# Patient Record
Sex: Female | Born: 1950
Health system: Southern US, Community
[De-identification: ages and names within clinical notes are randomized; demographics above are authoritative.]

## PROBLEM LIST (undated history)

## (undated) DIAGNOSIS — J45909 Unspecified asthma, uncomplicated: Secondary | ICD-10-CM

## (undated) DIAGNOSIS — T7840XA Allergy, unspecified, initial encounter: Secondary | ICD-10-CM

## (undated) DIAGNOSIS — M1990 Unspecified osteoarthritis, unspecified site: Secondary | ICD-10-CM

## (undated) DIAGNOSIS — K219 Gastro-esophageal reflux disease without esophagitis: Secondary | ICD-10-CM

## (undated) DIAGNOSIS — G43909 Migraine, unspecified, not intractable, without status migrainosus: Secondary | ICD-10-CM

## (undated) DIAGNOSIS — I10 Essential (primary) hypertension: Secondary | ICD-10-CM

## (undated) DIAGNOSIS — J302 Other seasonal allergic rhinitis: Secondary | ICD-10-CM

## (undated) HISTORY — PX: TOOTH EXTRACTION: SUR596

## (undated) HISTORY — DX: Allergy, unspecified, initial encounter: T78.40XA

## (undated) HISTORY — DX: Gastro-esophageal reflux disease without esophagitis: K21.9

## (undated) HISTORY — PX: TUBAL LIGATION: SHX77

## (undated) HISTORY — DX: Unspecified osteoarthritis, unspecified site: M19.90

## (undated) HISTORY — DX: Migraine, unspecified, not intractable, without status migrainosus: G43.909

## (undated) HISTORY — DX: Unspecified asthma, uncomplicated: J45.909

## (undated) HISTORY — DX: Other seasonal allergic rhinitis: J30.2

---

## 2012-05-31 ENCOUNTER — Emergency Department (HOSPITAL_COMMUNITY)
Admission: EM | Admit: 2012-05-31 | Discharge: 2012-05-31 | Disposition: A | Discharge status: Home / Self Care | Payer: No Typology Code available for payment source | Attending: Emergency Medicine | Admitting: Emergency Medicine

## 2012-05-31 ENCOUNTER — Emergency Department (HOSPITAL_COMMUNITY): Payer: No Typology Code available for payment source

## 2012-05-31 ENCOUNTER — Encounter (HOSPITAL_COMMUNITY): Payer: Self-pay

## 2012-05-31 DIAGNOSIS — IMO0002 Reserved for concepts with insufficient information to code with codable children: Secondary | ICD-10-CM | POA: Insufficient documentation

## 2012-05-31 DIAGNOSIS — Y92410 Unspecified street and highway as the place of occurrence of the external cause: Secondary | ICD-10-CM | POA: Insufficient documentation

## 2012-05-31 DIAGNOSIS — IMO0001 Reserved for inherently not codable concepts without codable children: Secondary | ICD-10-CM | POA: Insufficient documentation

## 2012-05-31 DIAGNOSIS — Y939 Activity, unspecified: Secondary | ICD-10-CM | POA: Insufficient documentation

## 2012-05-31 DIAGNOSIS — I10 Essential (primary) hypertension: Secondary | ICD-10-CM | POA: Insufficient documentation

## 2012-05-31 DIAGNOSIS — T148XXA Other injury of unspecified body region, initial encounter: Secondary | ICD-10-CM

## 2012-05-31 HISTORY — DX: Essential (primary) hypertension: I10

## 2012-05-31 MED ORDER — TRAMADOL HCL 50 MG PO TABS: 50.0000 mg | ORAL_TABLET | Freq: Four times a day (QID) | ORAL | Status: DC | PRN
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
## 2012-06-01 NOTE — ED Provider Notes (Signed)
Medical screening examination/treatment/procedure(s) were performed by non-physician practitioner and as supervising physician I was immediately available for consultation/collaboration.  Cherylyn Sundby M Jaryd Drew, MD 06/01/12 0123 

## 2017-03-15 DIAGNOSIS — Z23 Encounter for immunization: Secondary | ICD-10-CM | POA: Diagnosis not present

## 2017-08-13 ENCOUNTER — Encounter (HOSPITAL_BASED_OUTPATIENT_CLINIC_OR_DEPARTMENT_OTHER): Payer: Self-pay

## 2017-08-13 ENCOUNTER — Emergency Department (HOSPITAL_BASED_OUTPATIENT_CLINIC_OR_DEPARTMENT_OTHER)
Admission: EM | Admit: 2017-08-13 | Discharge: 2017-08-13 | Disposition: A | Discharge status: Home / Self Care | Payer: Medicare Other | Attending: Emergency Medicine | Admitting: Emergency Medicine

## 2017-08-13 ENCOUNTER — Emergency Department (HOSPITAL_BASED_OUTPATIENT_CLINIC_OR_DEPARTMENT_OTHER): Payer: Medicare Other

## 2017-08-13 ENCOUNTER — Other Ambulatory Visit: Payer: Self-pay

## 2017-08-13 DIAGNOSIS — R519 Headache, unspecified: Secondary | ICD-10-CM

## 2017-08-13 DIAGNOSIS — R51 Headache: Secondary | ICD-10-CM | POA: Diagnosis not present

## 2017-08-13 DIAGNOSIS — Z87891 Personal history of nicotine dependence: Secondary | ICD-10-CM | POA: Insufficient documentation

## 2017-08-13 DIAGNOSIS — R42 Dizziness and giddiness: Secondary | ICD-10-CM | POA: Insufficient documentation

## 2017-08-13 DIAGNOSIS — I10 Essential (primary) hypertension: Secondary | ICD-10-CM | POA: Insufficient documentation

## 2017-08-13 DIAGNOSIS — M791 Myalgia, unspecified site: Secondary | ICD-10-CM | POA: Diagnosis not present

## 2017-08-13 LAB — BASIC METABOLIC PANEL
Anion gap: 8 (ref 5–15)
BUN: 9 mg/dL (ref 6–20)
CHLORIDE: 102 mmol/L (ref 101–111)
CO2: 26 mmol/L (ref 22–32)
CREATININE: 0.93 mg/dL (ref 0.44–1.00)
Calcium: 9.4 mg/dL (ref 8.9–10.3)
GFR calc Af Amer: >60 mL/min (ref >60)
Glucose, Bld: 116 mg/dL — ABNORMAL HIGH (ref 65–99)
POTASSIUM: 3.6 mmol/L (ref 3.5–5.1)
Sodium: 136 mmol/L (ref 135–145)

## 2017-08-13 LAB — CBC
HCT: 37.2 % (ref 36.0–46.0)
Hemoglobin: 12.6 g/dL (ref 12.0–15.0)
MCH: 30.6 pg (ref 26.0–34.0)
MCHC: 33.9 g/dL (ref 30.0–36.0)
MCV: 90.3 fL (ref 78.0–100.0)
PLATELETS: 226 10*3/uL (ref 150–400)
RBC: 4.12 MIL/uL (ref 3.87–5.11)
RDW: 12.2 % (ref 11.5–15.5)
WBC: 4.7 10*3/uL (ref 4.0–10.5)

## 2017-08-13 MED ORDER — DIPHENHYDRAMINE HCL 50 MG/ML IJ SOLN
25.0000 mg | Freq: Once | INTRAMUSCULAR | Status: AC
  Administered 2017-08-13: 25 mg via INTRAVENOUS
  Filled 2017-08-13: qty 1

## 2017-08-13 MED ORDER — PROCHLORPERAZINE EDISYLATE 5 MG/ML IJ SOLN
10.0000 mg | Freq: Once | INTRAMUSCULAR | Status: AC
  Administered 2017-08-13: 10 mg via INTRAVENOUS
  Filled 2017-08-13: qty 2

## 2017-08-13 MED ORDER — KETOROLAC TROMETHAMINE 15 MG/ML IJ SOLN
15.0000 mg | Freq: Once | INTRAMUSCULAR | Status: AC
  Administered 2017-08-13: 15 mg via INTRAVENOUS
  Filled 2017-08-13: qty 1

## 2017-08-13 NOTE — ED Provider Notes (Signed)
Complains of frontal throbbing headache gradual onset 2 days ago was left arm and left leg pain.  She has not had similar headaches before.  She does get migraine headaches approximately once per month.  She is presently asymptomatic since treatment here.  On exam alert Glasgow Coma Score 15 appears in no distress neurologic exam gait normal Romberg normal pronator drift normal cranial nerves II through XII grossly intact.  All 4 extremities neurovascular intact.   Orlie Dakin, MD 08/13/17 [phone]

## 2017-08-13 NOTE — ED Notes (Signed)
NAD at this time. Pt is stable and going home.  

## 2017-08-13 NOTE — ED Triage Notes (Addendum)
Pt c/o pain "to my left side"-pain to left side of head, neck, arm and leg x 2 days-feeling unbalanced x today-denies injury-NAD-steady gait-pt later added left ear feels "stopped up"

## 2017-08-13 NOTE — ED Provider Notes (Signed)
[facility] EMERGENCY DEPARTMENT Provider Note   CSN: [id number] Arrival date & time: 08/13/17  1403     History   Chief Complaint Chief Complaint  Patient presents with  . Headache    HPI Joann Drake is a 67 y.o. female.  HPI   Joann Drake is a 67 year old female with a history of hypertension and migraines who presents to the emergency department for evaluation of headache.  Patient reports that her headache began 2 days ago.  It has been located in the bitemporal area, but now seems to be localizing on the left side of her head.  She states that her pain is 8/10 in severity at this time, feels "burning" in nature.  She reports that the pain spreads down her neck and into her left arm and posterior left leg.  She states that this is atypical of her normal migraine because the pain spreads to the left side of her body.  She tried taking Aleve and Tylenol with some improvement, but did not completely resolve her symptoms.  She states that she has photophobia at times.  She denies associated nausea/vomiting.  States that this morning around 1 AM she felt somewhat off balance, as if she had to lean towards the side to walk.  She denies visual disturbance, numbness, weakness, dysarthria, dysphagia, neck stiffness, fever, chest pain, shortness of breath, abdominal pain.  Denies sudden onset worst headache of her life. Denies history of cancer. She is able to ambulate independently without difficulty.   Past Medical History:  Diagnosis Date  . Hypertension     There are no active problems to display for this patient.   Past Surgical History:  Procedure Laterality Date  . TUBAL LIGATION      OB History    No data available       Home Medications    Prior to Admission medications   Medication Sig Start Date End Date Taking? Authorizing Provider  AMLODIPINE BESYLATE PO Take by mouth.   Yes [provider]    Family History No family history on  file.  Social History Social History   Tobacco Use  . Smoking status: Former Research scientist (life sciences)  . Smokeless tobacco: Never Used  Substance Use Topics  . Alcohol use: No  . Drug use: No     Allergies   Penicillins   Review of Systems Review of Systems  Constitutional: Negative for chills and fever.  HENT: Negative for facial swelling, sinus pressure and sinus pain.   Eyes: Positive for photophobia. Negative for visual disturbance.  Respiratory: Negative for shortness of breath.   Cardiovascular: Negative for chest pain.  Gastrointestinal: Negative for abdominal pain, nausea and vomiting.  Genitourinary: Negative for difficulty urinating.  Musculoskeletal: Positive for myalgias (left arm and leg pain). Negative for back pain, gait problem and neck stiffness.  Skin: Negative for rash.  Neurological: Positive for dizziness (feels "off balance") and headaches. Negative for speech difficulty, weakness and numbness.  Psychiatric/Behavioral: Negative for agitation.     Physical Exam Updated Vital Signs BP (!) 145/99 (BP Location: Right Arm)   Pulse 74   Temp 98.3 F (36.8 C) (Oral)   Resp 18   Ht 5\' 2"  (1.575 m)   Wt 84.9 kg (187 lb 2.7 oz)   SpO2 100%   BMI 34.23 kg/m   Physical Exam  Constitutional: She is oriented to person, place, and time. She appears well-developed and well-nourished. No distress.  HENT:  Head: Normocephalic and atraumatic.  Mouth/Throat: Oropharynx is clear and moist. No oropharyngeal exudate.  No tenderness or erythema over temporal arteries.  Eyes: Conjunctivae are normal. Pupils are equal, round, and reactive to light. Right eye exhibits no discharge. Left eye exhibits no discharge.  Neck: Normal range of motion. Neck supple.  Cardiovascular: Normal rate, regular rhythm and intact distal pulses.  Pulmonary/Chest: Effort normal and breath sounds normal. No stridor. No respiratory distress. She has no wheezes. She has no rales.  Abdominal: Soft. Bowel  sounds are normal. There is no tenderness.  Musculoskeletal: Normal range of motion.  No tenderness to palpation over the arms or legs. No rash, ecchymosis or erythema over the legs or arms. Intact distal pulses in all extremities.   Lymphadenopathy:    She has no cervical adenopathy.  Neurological: She is alert and oriented to person, place, and time. Coordination normal.  Mental Status:  Alert, oriented, thought content appropriate, able to give a coherent history. Speech fluent without evidence of aphasia. Able to follow 2 step commands without difficulty.  Cranial Nerves:  II:  Peripheral visual fields grossly normal, pupils equal, round, reactive to light III,IV, VI: ptosis not present, extra-ocular motions intact bilaterally  V,VII: smile symmetric, facial light touch sensation equal VIII: hearing grossly normal to voice  X: uvula elevates symmetrically  XI: bilateral shoulder shrug symmetric and strong XII: midline tongue extension without fassiculations Motor:  Normal tone. 5/5 in upper and lower extremities bilaterally including strong and equal grip strength and dorsiflexion/plantar flexion Sensory: Pinprick and light touch normal in all extremities.  Deep Tendon Reflexes: 1+ and symmetric in the patella Cerebellar: normal finger-to-nose with bilateral upper extremities Gait: normal gait and balance CV: distal pulses palpable throughout   Skin: Skin is warm and dry. Capillary refill takes less than 2 seconds. She is not diaphoretic.  Psychiatric: She has a normal mood and affect. Her behavior is normal.  Nursing note and vitals reviewed.    ED Treatments / Results  Labs (all labs ordered are listed, but only abnormal results are displayed) Labs Reviewed - No data to display  EKG  EKG Interpretation None       Radiology No results found.  Procedures Procedures (including critical care time)  Medications Ordered in ED Medications - No data to  display   Initial Impression / Assessment and Plan / ED Course  I have reviewed the triage vital signs and the nursing notes.  Pertinent labs & imaging results that were available during my care of the patient were reviewed by me and considered in my medical decision making (see chart for details).     Patient a history of migraine headaches presents to the emergency department for evaluation of left-sided headache which is been ongoing for the past 2 days.  She also reports pain in her left arm and leg as well which is different from her typical headache.  CT head without acute abnormality. Patient HA treated and improved while in ED, with subsequent improvement in pain of her arm and leg. No fever or nuchal rigidity to suggest meningitis. Presentation non-concerning for temporal arteritis. No sudden onset worst headache of my life, no concern for [facility]. Pt is afebrile with no focal neuro deficits or change in vision. Able to walk independently in the ER without difficulty. Pt is to follow up with PCP to discuss prophylactic medication. Her blood pressure was elevated in the ER today, counseled her to have this rechecked and managed by her PCP. Her blood sugar was  also mildly elevated 116, discussed having this rechecked by her PCP as well. This was a shared visit with Dr. Winfred Leeds who also saw the patient and agrees with plan and discharge. Pt verbalizes understanding and is agreeable with plan.    Final Clinical Impressions(s) / ED Diagnoses   Final diagnoses:  Acute nonintractable headache, unspecified headache type    ED Discharge Orders    None       Glyn Ade, PA-C 08/13/17 1607    Orlie Dakin, MD 08/13/17 [phone]

## 2017-08-13 NOTE — Discharge Instructions (Signed)
The CT scan of your head and blood work was reassuring.  Your blood pressure was mildly elevated in the ER today, please have this rechecked and monitored by your regular doctor in the next 3 weeks. You blood sugar was also somewhat elevated (bg 116), please have this checked as well by your regular doctor.   Return to the ER if you have headache with trouble seeing, numbness or weakness. Please also return if your have any new or concerning symptoms.

## 2017-09-03 ENCOUNTER — Encounter: Payer: Self-pay | Admitting: Family Medicine

## 2017-09-03 ENCOUNTER — Ambulatory Visit (INDEPENDENT_AMBULATORY_CARE_PROVIDER_SITE_OTHER): Payer: Medicare Other | Admitting: Family Medicine

## 2017-09-03 VITALS — BP 132/78 | HR 83 | Temp 98.1°F | Ht 60.25 in | Wt 184.0 lb

## 2017-09-03 DIAGNOSIS — Z7689 Persons encountering health services in other specified circumstances: Secondary | ICD-10-CM | POA: Diagnosis not present

## 2017-09-03 DIAGNOSIS — G43109 Migraine with aura, not intractable, without status migrainosus: Secondary | ICD-10-CM

## 2017-09-03 DIAGNOSIS — H6122 Impacted cerumen, left ear: Secondary | ICD-10-CM

## 2017-09-03 DIAGNOSIS — R7309 Other abnormal glucose: Secondary | ICD-10-CM

## 2017-09-03 DIAGNOSIS — Z1211 Encounter for screening for malignant neoplasm of colon: Secondary | ICD-10-CM

## 2017-09-03 DIAGNOSIS — I10 Essential (primary) hypertension: Secondary | ICD-10-CM | POA: Insufficient documentation

## 2017-09-03 DIAGNOSIS — R7303 Prediabetes: Secondary | ICD-10-CM | POA: Insufficient documentation

## 2017-09-03 DIAGNOSIS — R42 Dizziness and giddiness: Secondary | ICD-10-CM | POA: Diagnosis not present

## 2017-09-03 MED ORDER — HYDROCHLOROTHIAZIDE 25 MG PO TABS: 25.0000 mg | ORAL_TABLET | Freq: Every day | ORAL | 3 refills | Status: DC

## 2017-09-03 MED ORDER — AMLODIPINE BESYLATE 5 MG PO TABS: 5.0000 mg | ORAL_TABLET | Freq: Every day | ORAL | 3 refills | Status: DC

## 2017-09-03 NOTE — Progress Notes (Signed)
Subjective:    Patient ID: Joann Drake, female    DOB: [birth year], 67 y.o.   MRN: [id number]  HPI This is a 67 yo female who presents today to establish care. She is married. She has 4 children (one son passed away 2 years ago). Has 7 grandchildren, 7 great grandchildren. She is a retired Theme park manager. Enjoys gardening, takes care of 2 of her grandchildren.   Has been getting care from Josue Hector, NP.   Headache- was seen 08/13/17 in ER with headache, negative head CT, has been diagnosed with migraine in past. Pattern varies, sometimes several times a month, sometimes more often. Has not been able to identify triggers. Takes Excedrin Migraine, Alleve with little relief. +photophobia, +phonophobia.   Has been having intermittent vertigo, sometimes with position change, but not always. Left ear feels clogged up. She instilled some OTC wax removal which made her ear feel more stopped up. No pain. Uses cotton swabs daily, does not get much out of her ear.   HTN- controlled on amlodipine 5 mg and HCTZ 25 mg. Labs in ER 08/13/17 showed normal BUN/Creatinine, elevated blood sugar, normal electrolytes.   Last CPE- a couple of years ago [facility], 1-2 years, does SBE Pap- a couple of years Colonoscopy- never Tdap- unsure Flu- annual and has had a pneumonia shot Eye- 3 years Dental- dentures Exercise- not as regularly since she started watching her grandchildren, previously walked a lot.  Sleep- 6-8 hours a night   Past Medical History:  Diagnosis Date  . Allergy   . Arthritis   . Hypertension   . Migraine    Past Surgical History:  Procedure Laterality Date  . TOOTH EXTRACTION    . TUBAL LIGATION     Family History  Problem Relation Age of Onset  . Asthma Mother   . Hypertension Mother   . Miscarriages / [place] Mother   . Stroke Mother   . Pneumonia Father   . Heart disease Sister   . Hypertension Sister   . Stroke Sister   . Kidney disease  Daughter   . Early death Son   . Kidney disease Son    Social History   Tobacco Use  . Smoking status: Former Research scientist (life sciences)  . Smokeless tobacco: Never Used  Substance Use Topics  . Alcohol use: No  . Drug use: No      Review of Systems Per HPI    Objective:   Physical Exam  Constitutional: She is oriented to person, place, and time. She appears well-developed and well-nourished. No distress.  HENT:  Head: Normocephalic and atraumatic.  Right Ear: Tympanic membrane, external ear and ear canal normal.  Left Ear: External ear normal.  Mouth/Throat: Oropharynx is clear and moist.  Left TM occluded by cerumen. Irrigated with warm tap water without relief.   Eyes: Conjunctivae are normal.  Cardiovascular: Normal rate, regular rhythm and normal heart sounds.  Pulmonary/Chest: Effort normal and breath sounds normal.  Musculoskeletal: She exhibits no edema.  Neurological: She is alert and oriented to person, place, and time.  Skin: Skin is warm and dry. She is not diaphoretic.  Psychiatric: She has a normal mood and affect. Her behavior is normal. Judgment and thought content normal.  Vitals reviewed.     BP 132/78   Pulse 83   Temp 98.1 F (36.7 C) (Oral)   Ht 5' 0.25" (1.53 m)   Wt 184 lb (83.5 kg)   SpO2 97%   BMI 35.64  kg/m      Orthostatic VS for the past 24 hrs:  BP- Lying Pulse- Lying BP- Sitting Pulse- Sitting BP- Standing at 0 minutes Pulse- Standing at 0 minutes  09/03/17 1650 136/88 76 136/84 77 (!) 134/92 90     Assessment & Plan:  1. Encounter to establish care - will request records from prior PCP - follow up in 3 months for CPE  2. Essential hypertension - well controlled today - amLODipine (NORVASC) 5 MG tablet; Take 1 tablet (5 mg total) by mouth daily.  Dispense: 90 tablet; Refill: 3 - hydrochlorothiazide (HYDRODIURIL) 25 MG tablet; Take 1 tablet (25 mg total) by mouth daily.  Dispense: 90 tablet; Refill: 3 - TSH - Lipid Panel  3. Elevated  glucose - Hemoglobin A1c  4. Migraine with aura and without status migrainosus, not intractable - Ambulatory referral to Neurology  5. Vertigo - She is not orthostatic, will treat cerumen impaction - RTC precautions reviewed  6. Impacted cerumen of left ear - she was given instructions for mineral oil on cotton ball x 5 minutes in ear every other day for 7 days  7. Colon cancer screening - discussed options for screening including Colo guard and colonoscopy, will discuss further at upcoming visit.   Clarene Reamer, FNP-BC  [facility]  09/03/2017 5:22 PM

## 2017-09-03 NOTE — Patient Instructions (Signed)
Good to see you today  Please schedule your complete physical for 3 months   Migraine Headache A migraine headache is a very strong throbbing pain on one side or both sides of your head. Migraines can also cause other symptoms. Talk with your doctor about what things may bring on (trigger) your migraine headaches. Follow these instructions at home: Medicines  Take over-the-counter and prescription medicines only as told by your doctor.  Do not drive or use heavy machinery while taking prescription pain medicine.  To prevent or treat constipation while you are taking prescription pain medicine, your doctor may recommend that you: ? Drink enough fluid to keep your pee (urine) clear or pale yellow. ? Take over-the-counter or prescription medicines. ? Eat foods that are high in fiber. These include fresh fruits and vegetables, whole grains, and beans. ? Limit foods that are high in fat and processed sugars. These include fried and sweet foods. Lifestyle  Avoid alcohol.  Do not use any products that contain nicotine or tobacco, such as cigarettes and e-cigarettes. If you need help quitting, ask your doctor.  Get at least 8 hours of sleep every night.  Limit your stress. General instructions   Keep a journal to find out what may bring on your migraines. For example, write down: ? What you eat and drink. ? How much sleep you get. ? Any change in what you eat or drink. ? Any change in your medicines.  If you have a migraine: ? Avoid things that make your symptoms worse, such as bright lights. ? It may help to lie down in a dark, quiet room. ? Do not drive or use heavy machinery. ? Ask your doctor what activities are safe for you.  Keep all follow-up visits as told by your doctor. This is important. Contact a doctor if:  You get a migraine that is different or worse than your usual migraines. Get help right away if:  Your migraine gets very bad.  You have a fever.  You  have a stiff neck.  You have trouble seeing.  Your muscles feel weak or like you cannot control them.  You start to lose your balance a lot.  You start to have trouble walking.  You pass out (faint). This information is not intended to replace advice given to you by your health care provider. Make sure you discuss any questions you have with your health care provider. Document Released: 02/22/2008 Document Revised: 12/03/2015 Document Reviewed: 11/01/2015 Elsevier Interactive Patient Education  2018 [employer].

## 2017-09-04 LAB — LIPID PANEL
CHOL/HDL RATIO: 3
Cholesterol: 210 mg/dL — ABNORMAL HIGH (ref 0–200)
HDL: 64.2 mg/dL (ref >39.00)
LDL Cholesterol: 107 mg/dL — ABNORMAL HIGH (ref 0–99)
NonHDL: 146.15
TRIGLYCERIDES: 198 mg/dL — AB (ref 0.0–149.0)
VLDL: 39.6 mg/dL (ref 0.0–40.0)

## 2017-09-04 LAB — HEMOGLOBIN A1C: Hgb A1c MFr Bld: 5.9 % (ref 4.6–6.5)

## 2017-09-04 LAB — TSH: TSH: 1.32 u[IU]/mL (ref 0.35–4.50)

## 2017-09-28 ENCOUNTER — Telehealth: Payer: Self-pay

## 2017-09-28 NOTE — Telephone Encounter (Signed)
Called and left voicemail explaining recommendations. (Verified on DPR)

## 2017-09-28 NOTE — Telephone Encounter (Signed)
Pt walked in and told carrie since blood sugar was elevated wants glucose meter to [facility]. 09/03/17 A1C was 5.9.Not on any meds for diabetes.Please advise. Pt last seen 09/03/17.

## 2017-09-28 NOTE — Telephone Encounter (Signed)
Please call patient and tell her that we can talk about testing her blood sugars at home next month at her visit. Remind her that she is in the prediabetic range and we usually don't have people do home testing.

## 2017-11-07 ENCOUNTER — Ambulatory Visit (INDEPENDENT_AMBULATORY_CARE_PROVIDER_SITE_OTHER): Payer: Medicare Other | Admitting: Diagnostic Neuroimaging

## 2017-11-07 ENCOUNTER — Encounter: Payer: Self-pay | Admitting: Diagnostic Neuroimaging

## 2017-11-07 ENCOUNTER — Encounter (INDEPENDENT_AMBULATORY_CARE_PROVIDER_SITE_OTHER): Payer: Self-pay

## 2017-11-07 VITALS — BP 120/75 | HR 66 | Ht 60.25 in | Wt 173.0 lb

## 2017-11-07 DIAGNOSIS — G43101 Migraine with aura, not intractable, with status migrainosus: Secondary | ICD-10-CM | POA: Diagnosis not present

## 2017-11-07 MED ORDER — TOPIRAMATE 50 MG PO TABS: 50.0000 mg | ORAL_TABLET | Freq: Two times a day (BID) | ORAL | 12 refills | Status: DC

## 2017-11-07 MED ORDER — RIZATRIPTAN BENZOATE 10 MG PO TBDP: 10.0000 mg | ORAL_TABLET | ORAL | 11 refills | Status: DC | PRN

## 2017-11-07 NOTE — Patient Instructions (Signed)
  MIGRAINE PREVENTION - start topiramate 50mg  at bedtime; after 1 week increase to twice a day; drink plenty of water  MIGRAINE RESCUE - rizatriptan 10mg  as needed for breakthrough headache; may repeat x 1 after 2 hours; max 2 tabs per day or 8 per month

## 2017-11-07 NOTE — Progress Notes (Signed)
[facility]  PATIENT: Joann Drake DOB: 1950/11/23  REFERRING CLINICIAN: Glenda Chroman, NP HISTORY FROM: patient  REASON FOR VISIT: new consult    HISTORICAL  CHIEF COMPLAINT:  Chief Complaint  Patient presents with  . Migraine    rm 7, New Pt, "chronic migraine headaches that wake me during sleeping, tense all down my neck; have had migraiens for years, was on med that gave me hallucinations and memory loss; no meds in a long time""    HISTORY OF PRESENT ILLNESS:   67 year old female here for evaluation of headaches.  Patient has had headaches since teenage years.  She describes global, unilateral, migratory severe throbbing headaches with nausea, photophobia and phonophobia.  Headaches would last hours, days or even weeks at a time.  She tried over-the-counter medications without relief.  She has tried tramadol and another prescription medication without relief.  Sometimes she feels visual aura symptoms before onset of headache.  Triggering factors could include caffeine and certain types of food such as raisins or other sweets.  Patient is averaging at least 20 days of headache per month.  Headaches have not significantly changed in characteristics over time.  She is having more frequent headaches, but gradually worsening over many years.  No other associated neurologic symptoms such as numbness, weakness, slurred speech.  REVIEW OF SYSTEMS: Full 14 system review of systems performed and negative with exception of: Headache dizziness joint pain aching muscles murmur.   ALLERGIES: Allergies  Allergen Reactions  . Penicillins Hives and Rash    HOME MEDICATIONS: Outpatient Medications Prior to Visit  Medication Sig Dispense Refill  . amLODipine (NORVASC) 5 MG tablet Take 1 tablet (5 mg total) by mouth daily. 90 tablet 3  . hydrochlorothiazide (HYDRODIURIL) 25 MG tablet Take 1 tablet (25 mg total) by mouth daily. 90 tablet 3   No  facility-administered medications prior to visit.     PAST MEDICAL HISTORY: Past Medical History:  Diagnosis Date  . Allergy   . Arthritis   . Hypertension   . Migraine     PAST SURGICAL HISTORY: Past Surgical History:  Procedure Laterality Date  . TOOTH EXTRACTION    . TUBAL LIGATION      FAMILY HISTORY: Family History  Problem Relation Age of Onset  . Asthma Mother   . Hypertension Mother   . Miscarriages / [place] Mother   . Stroke Mother   . Pneumonia Father   . Heart disease Sister   . Hypertension Sister   . Stroke Sister   . Kidney disease Daughter   . Early death Son   . Kidney disease Son     SOCIAL HISTORY:  Social History   Socioeconomic History  . Marital status: Married    Spouse name: Not on file  . Number of children: Not on file  . Years of education: Not on file  . Highest education level: Not on file  Occupational History  . Not on file  Social Needs  . Financial resource strain: Not on file  . Food insecurity:    Worry: Not on file    Inability: Not on file  . Transportation needs:    Medical: Not on file    Non-medical: Not on file  Tobacco Use  . Smoking status: Former Research scientist (life sciences)  . Smokeless tobacco: Never Used  Substance and Sexual Activity  . Alcohol use: No  . Drug use: No  . Sexual activity: Not Currently  Lifestyle  . Physical activity:  Days per week: Not on file    Minutes per session: Not on file  . Stress: Not on file  Relationships  . Social connections:    Talks on phone: Not on file    Gets together: Not on file    Attends religious service: Not on file    Active member of club or organization: Not on file    Attends meetings of clubs or organizations: Not on file    Relationship status: Not on file  . Intimate partner violence:    Fear of current or ex partner: Not on file    Emotionally abused: Not on file    Physically abused: Not on file    Forced sexual activity: Not on file  Other Topics Concern    . Not on file  Social History Narrative   Married   Education- college   Children - 3 living     PHYSICAL EXAM  GENERAL EXAM/CONSTITUTIONAL: Vitals:  Vitals:   11/07/17 0947  BP: 120/75  Pulse: 66  Weight: 173 lb (78.5 kg)  Height: 5' 0.25" (1.53 m)     Body mass index is 33.51 kg/m.  Visual Acuity Screening   Right eye Left eye Both eyes  Without correction:     With correction: 20/70 20/100   Comments: Wears bifocals, unable to see well    Patient is in no distress; well developed, nourished and groomed; neck is supple  CARDIOVASCULAR:  Examination of carotid arteries is normal; no carotid bruits  Regular rate and rhythm, no murmurs  Examination of peripheral vascular system by observation and palpation is normal  EYES:  Ophthalmoscopic exam of optic discs and posterior segments is normal; no papilledema or hemorrhages  MUSCULOSKELETAL:  Gait, strength, tone, movements noted in Neurologic exam below  NEUROLOGIC: MENTAL STATUS:  No flowsheet data found.  awake, alert, oriented to person, place and time  recent and remote memory intact  normal attention and concentration  language fluent, comprehension intact, naming intact,   fund of knowledge appropriate  CRANIAL NERVE:   2nd - no papilledema on fundoscopic exam  2nd, 3rd, 4th, 6th - pupils equal and reactive to light, visual fields full to confrontation, extraocular muscles intact, no nystagmus  5th - facial sensation symmetric  7th - facial strength symmetric  8th - hearing intact  9th - palate elevates symmetrically, uvula midline  11th - shoulder shrug symmetric  12th - tongue protrusion midline  MOTOR:   normal bulk and tone, full strength in the BUE, BLE  SENSORY:   normal and symmetric to light touch,  temperature, vibration  COORDINATION:   finger-nose-finger, fine finger movements normal  REFLEXES:   deep tendon reflexes present and symmetric  GAIT/STATION:    narrow based gait; romberg is negative    DIAGNOSTIC DATA (LABS, IMAGING, TESTING) - I reviewed patient records, labs, notes, testing and imaging myself where available.  Lab Results  Component Value Date   WBC 4.7 08/13/2017   HGB 12.6 08/13/2017   HCT 37.2 08/13/2017   MCV 90.3 08/13/2017   PLT 226 08/13/2017      Component Value Date/Time   NA 136 08/13/2017 1458   K 3.6 08/13/2017 1458   CL 102 08/13/2017 1458   CO2 26 08/13/2017 1458   GLUCOSE 116 (H) 08/13/2017 1458   BUN 9 08/13/2017 1458   CREATININE 0.93 08/13/2017 1458   CALCIUM 9.4 08/13/2017 1458   GFRNONAA >60 08/13/2017 1458   GFRAA >60 08/13/2017 1458  Lab Results  Component Value Date   CHOL 210 (H) 09/03/2017   HDL 64.20 09/03/2017   LDLCALC 107 (H) 09/03/2017   TRIG 198.0 (H) 09/03/2017   CHOLHDL 3 09/03/2017   Lab Results  Component Value Date   HGBA1C 5.9 09/03/2017   No results found for: VITAMINB12 Lab Results  Component Value Date   TSH 1.32 09/03/2017    08/13/17 CT head [I reviewed images myself and agree with interpretation. -VRP]  - normal     ASSESSMENT AND PLAN  67 y.o. year old female here with headaches with migraine features since teenage years, with continued headaches currently.  Neurologic exam and CT of the head are unremarkable.  We will proceed with migraine treatment.   Dx:  1. Migraine with aura and with status migrainosus, not intractable      PLAN:  MIGRAINE WITH AURA  - start topiramate 50mg  at bedtime; after 1 week increase to twice a day; drink plenty of water  - rizatriptan 10mg  as needed for breakthrough headache; may repeat x 1 after 2 hours; max 2 tabs per day or 8 per month  Meds ordered this encounter  Medications  . topiramate (TOPAMAX) 50 MG tablet    Sig: Take 1 tablet (50 mg total) by mouth 2 (two) times daily.    Dispense:  60 tablet    Refill:  12  . rizatriptan (MAXALT-MLT) 10 MG disintegrating tablet    Sig: Take 1 tablet (10 mg  total) by mouth as needed for migraine. May repeat in 2 hours if needed    Dispense:  9 tablet    Refill:  11   Return in about 6 months (around 05/09/2018) for with NP.    Penni Bombard, MD 0/81/4481, 85:63 AM Certified in Neurology, Neurophysiology and [facility] [address] [place], [place] [address] [phone]

## 2017-11-21 ENCOUNTER — Encounter: Payer: Medicare Other | Admitting: Family Medicine

## 2017-11-21 DIAGNOSIS — Z0289 Encounter for other administrative examinations: Secondary | ICD-10-CM

## 2017-12-31 ENCOUNTER — Other Ambulatory Visit: Payer: Self-pay

## 2017-12-31 ENCOUNTER — Emergency Department (HOSPITAL_COMMUNITY): Payer: Medicare Other

## 2017-12-31 ENCOUNTER — Ambulatory Visit: Payer: Self-pay | Admitting: *Deleted

## 2017-12-31 ENCOUNTER — Emergency Department (HOSPITAL_COMMUNITY)
Admission: EM | Admit: 2017-12-31 | Discharge: 2017-12-31 | Disposition: A | Discharge status: Home / Self Care | Payer: Medicare Other | Attending: Emergency Medicine | Admitting: Emergency Medicine

## 2017-12-31 ENCOUNTER — Encounter (HOSPITAL_COMMUNITY): Payer: Self-pay | Admitting: *Deleted

## 2017-12-31 DIAGNOSIS — Z87891 Personal history of nicotine dependence: Secondary | ICD-10-CM | POA: Insufficient documentation

## 2017-12-31 DIAGNOSIS — Z7982 Long term (current) use of aspirin: Secondary | ICD-10-CM | POA: Insufficient documentation

## 2017-12-31 DIAGNOSIS — Z79899 Other long term (current) drug therapy: Secondary | ICD-10-CM | POA: Insufficient documentation

## 2017-12-31 DIAGNOSIS — R0789 Other chest pain: Secondary | ICD-10-CM | POA: Diagnosis not present

## 2017-12-31 DIAGNOSIS — I10 Essential (primary) hypertension: Secondary | ICD-10-CM | POA: Insufficient documentation

## 2017-12-31 DIAGNOSIS — R079 Chest pain, unspecified: Secondary | ICD-10-CM | POA: Diagnosis not present

## 2017-12-31 LAB — CBC
HCT: 41.6 % (ref 36.0–46.0)
Hemoglobin: 13.3 g/dL (ref 12.0–15.0)
MCH: 29.6 pg (ref 26.0–34.0)
MCHC: 32 g/dL (ref 30.0–36.0)
MCV: 92.4 fL (ref 78.0–100.0)
PLATELETS: 248 10*3/uL (ref 150–400)
RBC: 4.5 MIL/uL (ref 3.87–5.11)
RDW: 12.6 % (ref 11.5–15.5)
WBC: 4.8 10*3/uL (ref 4.0–10.5)

## 2017-12-31 LAB — BASIC METABOLIC PANEL
Anion gap: 10 (ref 5–15)
BUN: 11 mg/dL (ref 8–23)
CO2: 28 mmol/L (ref 22–32)
CREATININE: 1.06 mg/dL — AB (ref 0.44–1.00)
Calcium: 10.2 mg/dL (ref 8.9–10.3)
Chloride: 98 mmol/L (ref 98–111)
GFR calc non Af Amer: 53 mL/min — ABNORMAL LOW (ref >60)
Glucose, Bld: 134 mg/dL — ABNORMAL HIGH (ref 70–99)
Potassium: 4.1 mmol/L (ref 3.5–5.1)
Sodium: 136 mmol/L (ref 135–145)

## 2017-12-31 LAB — I-STAT TROPONIN, ED
Troponin i, poc: 0 ng/mL (ref 0.00–0.08)
Troponin i, poc: 0 ng/mL (ref 0.00–0.08)

## 2017-12-31 MED ORDER — METHOCARBAMOL 500 MG PO TABS
1000.0000 mg | ORAL_TABLET | Freq: Once | ORAL | Status: AC
  Administered 2017-12-31: 1000 mg via ORAL
  Filled 2017-12-31: qty 2

## 2017-12-31 MED ORDER — METHOCARBAMOL 500 MG PO TABS: 500.0000 mg | ORAL_TABLET | Freq: Two times a day (BID) | ORAL | 0 refills | Status: DC

## 2017-12-31 MED ORDER — ACETAMINOPHEN 500 MG PO TABS
1000.0000 mg | ORAL_TABLET | Freq: Once | ORAL | Status: AC
  Administered 2017-12-31: 1000 mg via ORAL
  Filled 2017-12-31: qty 2

## 2017-12-31 NOTE — Discharge Instructions (Addendum)
Your work-up today is very reassuring, and does not suggest an acute problem with your heart or lungs causing your pain.  Since this pain can be reproduced on exam I think it is more likely musculoskeletal pain.  Please continue taking Tylenol, and you may also use Robaxin at night to help with this discomfort, Robaxin can cause drowsiness do not take before driving.  Please follow-up later this week with your primary care doctor.  Return to the emergency department for worsening chest pain, shortness of breath, lightheadedness or dizziness or any other new or concerning symptoms.

## 2017-12-31 NOTE — ED Provider Notes (Addendum)
Patient placed in Quick Look pathway, seen and evaluated   Chief Complaint: chest pain  HPI:  Joann Drake is a 67 y.o. female who presents to the ED with her family member for chest pain. Pt is in with chest pain under right breast and right shoulder and arm pain since. The pain started last night. Patient reports cough that started this morning that had light red sputum.   ROS: Resp: shortness of breath, productive cough  Cardiac: chest pain    Physical Exam:  BP 138/87 (BP Location: Left Arm)   Pulse 80   Temp 98.1 F (36.7 C) (Oral)   Resp 16   SpO2 100%    Gen: No distress  Neuro: Awake and Alert  Skin: Warm and dry  Chest: tender with palpation of the right chest wall. Increased pain with raising right arm, tender with palpation to the posterior right shoulder.     Initiation of care has begun. The patient has been counseled on the process, plan, and necessity for staying for the completion/evaluation, and the remainder of the medical screening examination    Ashley Murrain, NP 12/31/17 [place], [place], NP 01/03/18 1815    Quintella Reichert, MD 01/04/18 [phone]

## 2017-12-31 NOTE — Telephone Encounter (Signed)
Patient is experiencing right-sided CP under the breast that radiated to her back.  She was experiencing sweating with the pain during the night. Rates it 10 on the pain scale. Describes it as a sharp stabbing pain that has been constant since the onset yesterday evening. Has SOB that worsens with movement and deep breathing. Has a cough that started about the same time. Denies fever.Took one 81 mg asa this morning. Has not taken any thing else for the pain. Stated she had this type of pain several months ago. Referred patient to the ER. Daughter will drive her at this time to [facility] ER. Reason for Disposition . [1] Chest pain lasts > 5 minutes AND [2] age > 55 AND [3] at least one cardiac risk factor (i.e., hypertension, diabetes, obesity, smoker or strong family history of heart disease)  Answer Assessment - Initial Assessment Questions 1. LOCATION: "Where does it hurt?"       Right side under breast  2. RADIATION: "Does the pain go anywhere else?" (e.g., into neck, jaw, arms, back)     Radiates around to the back. 3. ONSET: "When did the chest pain begin?" (Minutes, hours or days)      Started yesterday evening. 4. PATTERN "Does the pain come and go, or has it been constant since it started?"  "Does it get worse with exertion?"      Constant pain and hurts worse with movement.  5. DURATION: "How long does it last" (e.g., seconds, minutes, hours)     Has not stopped being a 10 6. SEVERITY: "How bad is the pain?"  (e.g., Scale 1-10; mild, moderate, or severe)    - MILD (1-3): doesn't interfere with normal activities     - MODERATE (4-7): interferes with normal activities or awakens from sleep    - SEVERE (8-10): excruciating pain, unable to do any normal activities       severe 7. CARDIAC RISK FACTORS: "Do you have any history of heart problems or risk factors for heart disease?" (e.g., prior heart attack, angina; high blood pressure, diabetes, being overweight, high cholesterol, smoking, or  strong family history of heart disease)     Blood pressure,sister has heart disease 8. PULMONARY RISK FACTORS: "Do you have any history of lung disease?"  (e.g., blood clots in lung, asthma, emphysema, birth control pills)     asthma 9. CAUSE: "What do you think is causing the chest pain?"     Does not know. 10. OTHER SYMPTOMS: "Do you have any other symptoms?" (e.g., dizziness, nausea, vomiting, sweating, fever, difficulty breathing, cough)       Sweating during the night. SOB at times. Cough recently started.  11. PREGNANCY: "Is there any chance you are pregnant?" "When was your last menstrual period?"       no  Protocols used: CHEST PAIN-A-AH

## 2017-12-31 NOTE — ED Triage Notes (Signed)
Pt is in with chest pain under right breast and right shoulder and arm pain since.  No other pain.

## 2017-12-31 NOTE — Telephone Encounter (Signed)
Unable to reach pt or pts daughter to verify did go to ED.

## 2017-12-31 NOTE — Telephone Encounter (Signed)
Per EMR, patient presented to ED.

## 2017-12-31 NOTE — ED Provider Notes (Addendum)
[facility] EMERGENCY DEPARTMENT Provider Note   CSN: [id number] Arrival date & time: 12/31/17  1424     History   Chief Complaint Chief Complaint  Patient presents with  . Chest Pain    HPI Landry Costanza is a 67 y.o. female.  Betzaida Cremeens is a 67 y.o. Female with a history of hypertension, migraines and arthritis, who presents to the emergency department for evaluation of chest pain.  Patient reports that at about 3:00 in the morning she noticed some pain under her right breast and on the right side of her chest that seemed to wrap around a bit to her back.  Since then pain has been persistent, is worse with certain movements and when she tries to take a very deep breath.  Patient reports some very mild shortness of breath.  She has had some intermittent cough, occasionally productive sputum, and this seems to make the pain worse.  She denies any fevers or chills.  Chest pain is not worse with exertion, does not radiate to the arm neck or jaw.  No associated diaphoresis, nausea or vomiting.  No syncope or lightheadedness.  No associated abdominal pain.  Patient has not taken anything prior to arrival to treat the symptoms.  She denies any associated lower extremity swelling or pain.  No history of DVT or PE, no recent long distance travel or surgery, no hemoptysis.  Remote smoking history 40 years ago, no personal history of cardiac events, some family history and much older family members.     Past Medical History:  Diagnosis Date  . Allergy   . Arthritis   . Hypertension   . Migraine     Patient Active Problem List   Diagnosis Date Noted  . Essential hypertension 09/03/2017  . Elevated glucose 09/03/2017    Past Surgical History:  Procedure Laterality Date  . TOOTH EXTRACTION    . TUBAL LIGATION       OB History   None      Home Medications    Prior to Admission medications   Medication Sig Start Date End Date Taking? Authorizing  Provider  amLODipine (NORVASC) 5 MG tablet Take 1 tablet (5 mg total) by mouth daily. 09/03/17  Yes Elby Beck, FNP  aspirin EC 81 MG tablet Take 81 mg by mouth daily as needed (pain).   Yes [provider]  aspirin-acetaminophen-caffeine (EXCEDRIN MIGRAINE) [phone] MG tablet Take 2 tablets by mouth every 8 (eight) hours as needed for headache or migraine.   Yes [provider]  Aspirin-Salicylamide-Caffeine (BC HEADACHE POWDER PO) Take 1-2 packets by mouth 2 (two) times daily as needed (migraine).   Yes [provider]  hydrochlorothiazide (HYDRODIURIL) 25 MG tablet Take 1 tablet (25 mg total) by mouth daily. 09/03/17  Yes Elby Beck, FNP  mineral oil liquid See admin instructions. Instill 4-5 drops in to left ear as needed for wax buildup   Yes [provider]  Multiple Vitamin (MULTIVITAMIN WITH MINERALS) TABS tablet Take 1 tablet by mouth daily.   Yes [provider]  peppermint oil liquid Apply topically as needed (headache/migraine).   Yes [provider]  rizatriptan (MAXALT-MLT) 10 MG disintegrating tablet Take 1 tablet (10 mg total) by mouth as needed for migraine. May repeat in 2 hours if needed Patient not taking: Reported on 12/31/2017 11/07/17   Penumalli, Earlean Polka, MD  topiramate (TOPAMAX) 50 MG tablet Take 1 tablet (50 mg total) by mouth 2 (two) times  daily. Patient not taking: Reported on 12/31/2017 11/07/17   Penni Bombard, MD    Family History Family History  Problem Relation Age of Onset  . Asthma Mother   . Hypertension Mother   . Miscarriages / [place] Mother   . Stroke Mother   . Pneumonia Father   . Heart disease Sister   . Hypertension Sister   . Stroke Sister   . Kidney disease Daughter   . Early death Son   . Kidney disease Son     Social History Social History   Tobacco Use  . Smoking status: Former Research scientist (life sciences)  . Smokeless tobacco: Never Used  Substance Use Topics  . Alcohol use: No    . Drug use: No     Allergies   Penicillins   Review of Systems Review of Systems  Constitutional: Negative for chills and fever.  HENT: Negative for congestion, rhinorrhea and sore throat.   Eyes: Negative for visual disturbance.  Respiratory: Positive for cough and shortness of breath. Negative for chest tightness.   Cardiovascular: Positive for chest pain. Negative for palpitations and leg swelling.  Gastrointestinal: Negative for abdominal pain, nausea and vomiting.  Genitourinary: Negative for dysuria and frequency.  Musculoskeletal: Negative for arthralgias and myalgias.  Skin: Negative for color change and rash.  Neurological: Negative for dizziness, syncope, weakness, light-headedness, numbness and headaches.     Physical Exam Updated Vital Signs BP 114/67   Pulse 63   Temp 98.1 F (36.7 C) (Oral)   Resp 15   SpO2 99%   Physical Exam  Constitutional: She appears well-developed and well-nourished.  Non-toxic appearance. She does not appear ill. No distress.  HENT:  Head: Normocephalic and atraumatic.  Eyes: Pupils are equal, round, and reactive to light. EOM are normal. Right eye exhibits no discharge. Left eye exhibits no discharge.  Neck: Normal range of motion. Neck supple. No JVD present. No tracheal deviation present.  Cardiovascular: Normal rate, regular rhythm, normal heart sounds and intact distal pulses. Exam reveals no gallop and no friction rub.  No murmur heard. Pulses:      Radial pulses are 2+ on the right side, and 2+ on the left side.       Dorsalis pedis pulses are 2+ on the right side, and 2+ on the left side.       Posterior tibial pulses are 2+ on the right side, and 2+ on the left side.  Pulmonary/Chest: Effort normal and breath sounds normal. No respiratory distress.  Respirations equal and unlabored, patient able to speak in full sentences, lungs clear to auscultation bilaterally.  Chest pain is reproducible under the right breast and over  the right chest wall, there are no overlying skin changes, no breast tenderness  Abdominal: Soft. Bowel sounds are normal. She exhibits no distension and no mass. There is no tenderness. There is no guarding.  Abdomen soft, nondistended, nontender to palpation in all quadrants without guarding or peritoneal signs  Musculoskeletal:       Right lower leg: She exhibits no tenderness and no edema.       Left lower leg: She exhibits no tenderness and no edema.  Neurological: She is alert. Coordination normal.  Skin: Skin is warm and dry. Capillary refill takes less than 2 seconds. She is not diaphoretic.  Psychiatric: She has a normal mood and affect. Her behavior is normal.  Nursing note and vitals reviewed.    ED Treatments / Results  Labs (all labs ordered are listed,  but only abnormal results are displayed) Labs Reviewed  BASIC METABOLIC PANEL - Abnormal; Notable for the following components:      Result Value   Glucose, Bld 134 (*)    Creatinine, Ser 1.06 (*)    GFR calc non Af Amer 53 (*)    All other components within normal limits  CBC  I-STAT TROPONIN, ED  I-STAT TROPONIN, ED    EKG EKG Interpretation  Date/Time:  Monday December 31 2017 14:46:34 EDT Ventricular Rate:  80 PR Interval:  168 QRS Duration: 84 QT Interval:  366 QTC Calculation: 422 R Axis:   -4 Text Interpretation:  Normal sinus rhythm Low voltage QRS Nonspecific T wave abnormality Abnormal ECG Confirmed by Gerlene Fee [phone]) on 12/31/2017 9:28:23 PM   Radiology Dg Chest 2 View  Result Date: 12/31/2017 CLINICAL DATA:  Chest pain for 2 days EXAM: CHEST - 2 VIEW COMPARISON:  None. FINDINGS: The heart size and mediastinal contours are within normal limits. Both lungs are clear. The visualized skeletal structures are unremarkable. IMPRESSION: No active cardiopulmonary disease. Electronically Signed   By: Inez Catalina M.D.   On: 12/31/2017 16:07    Procedures Procedures (including critical care  time)  Medications Ordered in ED Medications  acetaminophen (TYLENOL) tablet 1,000 mg (1,000 mg Oral Given 12/31/17 1938)  methocarbamol (ROBAXIN) tablet 1,000 mg (1,000 mg Oral Given 12/31/17 1939)     Initial Impression / Assessment and Plan / ED Course  I have reviewed the triage vital signs and the nursing notes.  Pertinent labs & imaging results that were available during my care of the patient were reviewed by me and considered in my medical decision making (see chart for details).  Chest pain is not likely of cardiac or pulmonary etiology d/t presentation, chest pain is reproducible on exam.  PERC negative, VSS, no tracheal deviation, no JVD or new murmur, RRR, breath sounds equal bilaterally, EKG without acute abnormalities, negative troponin, and negative CXR.  Chest pain completely resolved with Tylenol and Robaxin.  Patient is to be discharged with recommendation to follow up with PCP in regards to today's hospital visit. Pt has been advised to return to the ED if CP becomes exertional, associated with diaphoresis or nausea, radiates to left jaw/arm, worsens or becomes concerning in any way. Pt appears reliable for follow up and is agreeable to discharge.   Case has been discussed with and seen by Dr. Sedonia Small who agrees with the above plan to discharge.    Final Clinical Impressions(s) / ED Diagnoses   Final diagnoses:  Atypical chest pain  Chest wall pain    ED Discharge Orders        Ordered    methocarbamol (ROBAXIN) 500 MG tablet  2 times daily     12/31/17 2112       Jacqlyn Larsen, [place] 12/31/17 2113    Jacqlyn Larsen, PA-C 12/31/17 2129    Maudie Flakes, MD 12/31/17 2259

## 2018-02-05 ENCOUNTER — Ambulatory Visit: Payer: Self-pay | Admitting: Family Medicine

## 2018-02-05 NOTE — Telephone Encounter (Signed)
I returned her call.   She is c/o being dizzy and very tired for the past week.   Feeling lightheaded.  See triage notes.  I made her an appt with Dr. Danise Mina for 02/06/18 at 10:15.  Clarene Reamer did not have any availability.  Reason for Disposition . [1] MODERATE dizziness (e.g., interferes with normal activities) AND [2] has NOT been evaluated by physician for this  (Exception: dizziness caused by heat exposure, sudden standing, or poor fluid intake)  Answer Assessment - Initial Assessment Questions 1. DESCRIPTION: "Describe your dizziness."     I've been dizziness for a week and feeling really tired. 2. LIGHTHEADED: "Do you feel lightheaded?" (e.g., somewhat faint, woozy, weak upon standing)     I feel light headed.   I've not fainted. 3. VERTIGO: "Do you feel like either you or the room is spinning or tilting?" (i.e. vertigo)     No 4. SEVERITY: "How bad is it?"  "Do you feel like you are going to faint?" "Can you stand and walk?"   - MILD - walking normally   - MODERATE - interferes with normal activities (e.g., work, school)    - SEVERE - unable to stand, requires support to walk, feels like passing out now.      Yes.  Sometimes I bump into the wall if I'm not straight.    When driving it's like something is going around.   I knew where I was but I felt confused.    I got home ok. 5. ONSET:  "When did the dizziness begin?"     Sometime last week maybe Saturday. 6. AGGRAVATING FACTORS: "Does anything make it worse?" (e.g., standing, change in head position)     When I get up I get more lightheaded and when I get out of bed. 7. HEART RATE: "Can you tell me your heart rate?" "How many beats in 15 seconds?"  (Note: not all patients can do this)       No 8. CAUSE: "What do you think is causing the dizziness?"     I don't know. 9. RECURRENT SYMPTOM: "Have you had dizziness before?" If so, ask: "When was the last time?" "What happened that time?"     I've had dizziness before but  not this tired feeling.   10. OTHER SYMPTOMS: "Do you have any other symptoms?" (e.g., fever, chest pain, vomiting, diarrhea, bleeding)       None of the above. 11. PREGNANCY: "Is there any chance you are pregnant?" "When was your last menstrual period?"       Not asked due to age  Protocols used: DIZZINESS [facility]

## 2018-02-05 NOTE — Telephone Encounter (Signed)
Unable to reach pt for any other symptoms and to advised should not drive if dizzy.

## 2018-02-06 ENCOUNTER — Ambulatory Visit (INDEPENDENT_AMBULATORY_CARE_PROVIDER_SITE_OTHER): Payer: Medicare Other | Admitting: Family Medicine

## 2018-02-06 ENCOUNTER — Encounter: Payer: Self-pay | Admitting: Family Medicine

## 2018-02-06 VITALS — BP 136/84 | HR 76 | Temp 98.7°F | Ht 60.25 in | Wt 180.0 lb

## 2018-02-06 DIAGNOSIS — R42 Dizziness and giddiness: Secondary | ICD-10-CM

## 2018-02-06 DIAGNOSIS — I10 Essential (primary) hypertension: Secondary | ICD-10-CM | POA: Diagnosis not present

## 2018-02-06 DIAGNOSIS — G43909 Migraine, unspecified, not intractable, without status migrainosus: Secondary | ICD-10-CM | POA: Diagnosis not present

## 2018-02-06 DIAGNOSIS — H6122 Impacted cerumen, left ear: Secondary | ICD-10-CM | POA: Insufficient documentation

## 2018-02-06 DIAGNOSIS — R5383 Other fatigue: Secondary | ICD-10-CM | POA: Diagnosis not present

## 2018-02-06 LAB — VITAMIN D 25 HYDROXY (VIT D DEFICIENCY, FRACTURES): VITD: 21.75 ng/mL — AB (ref 30.00–100.00)

## 2018-02-06 LAB — VITAMIN B12: Vitamin B-12: 903 pg/mL (ref 211–911)

## 2018-02-06 NOTE — Progress Notes (Signed)
BP 136/84 (BP Location: Left Arm, Patient Position: Sitting, Cuff Size: Large)   Pulse 76   Temp 98.7 F (37.1 C) (Oral)   Ht 5' 0.25" (1.53 m)   Wt 180 lb (81.6 kg)   SpO2 98%   BMI 34.86 kg/m   Orthostatic VS for the past 24 hrs (Last 3 readings):  BP- Lying BP- Standing at 0 minutes  02/06/18 1016 - (!) 150/100  02/06/18 1014 140/90 -    CC: dizziness, fatigue.  Subjective:    Patient ID: Joann Drake, female    DOB: Apr 28, 1951, 67 y.o.   MRN: [id number]  HPI: Joann Drake is a 67 y.o. female presenting on 02/06/2018 for Dizziness (C/o dizziness for about 1 wk. ) and Fatigue (C/o fatigue about 1 mo.)   Endorses constant fatigue over the past month and then dizziness "woozy feeling" worse over the past week, described as unsteadiness imbalance with walking, no vertigo or syncope/pre-syncope. Some mental fogginess also noted as well as some blurry vision R>L. No new headaches. Some R arm and shoulder pain worse in the mornings. Overall has difficulty describing symptoms today. Chronic L ear fullness - has been using mineral oil, had unsuccessful irrigation last office visit.  Episode a few months ago while driving of trouble differentiating right from left, got turned around - seen at ER with headache and head CT was unrevealing (07/2017) - referred to neurologist [facility]) who started her on topamax/maxalt. She takes excedrin migraine abortively for migraines - states she never tried topamax or maxalt due to cost. Having about 2 migraines/wk, not as severe as previously.   Denies fevers/chills, chest pain or dyspnea, palpitations. No unilateral weakness or numbness, facial weakness, slurred speech. No double vision. No paresthesias.   HTN - on amlodipine 5mg  daily, hctz 25mg  daily. No changes recently.   Relevant past medical, surgical, family and social history reviewed and updated as indicated. Interim medical history since our last visit reviewed. Allergies and  medications reviewed and updated. Outpatient Medications Prior to Visit  Medication Sig Dispense Refill  . amLODipine (NORVASC) 5 MG tablet Take 1 tablet (5 mg total) by mouth daily. 90 tablet 3  . aspirin EC 81 MG tablet Take 81 mg by mouth daily as needed (pain).    Marland Kitchen aspirin-acetaminophen-caffeine (EXCEDRIN MIGRAINE) 250-250-65 MG tablet Take 2 tablets by mouth every 8 (eight) hours as needed for headache or migraine.    . Aspirin-Salicylamide-Caffeine (BC HEADACHE POWDER PO) Take 1-2 packets by mouth 2 (two) times daily as needed (migraine).    . hydrochlorothiazide (HYDRODIURIL) 25 MG tablet Take 1 tablet (25 mg total) by mouth daily. 90 tablet 3  . methocarbamol (ROBAXIN) 500 MG tablet Take 1 tablet (500 mg total) by mouth 2 (two) times daily. 20 tablet 0  . mineral oil liquid See admin instructions. Instill 4-5 drops in to left ear as needed for wax buildup    . Multiple Vitamin (MULTIVITAMIN WITH MINERALS) TABS tablet Take 1 tablet by mouth daily.    . peppermint oil liquid Apply topically as needed (headache/migraine).    . rizatriptan (MAXALT-MLT) 10 MG disintegrating tablet Take 1 tablet (10 mg total) by mouth as needed for migraine. May repeat in 2 hours if needed 9 tablet 11  . topiramate (TOPAMAX) 50 MG tablet Take 1 tablet (50 mg total) by mouth 2 (two) times daily. 60 tablet 12   No facility-administered medications prior to visit.      Per HPI unless specifically indicated in ROS  section below Review of Systems     Objective:    BP 136/84 (BP Location: Left Arm, Patient Position: Sitting, Cuff Size: Large)   Pulse 76   Temp 98.7 F (37.1 C) (Oral)   Ht 5' 0.25" (1.53 m)   Wt 180 lb (81.6 kg)   SpO2 98%   BMI 34.86 kg/m   Wt Readings from Last 3 Encounters:  02/06/18 180 lb (81.6 kg)  11/07/17 173 lb (78.5 kg)  09/03/17 184 lb (83.5 kg)    Physical Exam  Constitutional: She appears well-developed and well-nourished. No distress.  HENT:  Head: Normocephalic and  atraumatic.  Right Ear: Hearing, tympanic membrane, external ear and ear canal normal.  Left Ear: Hearing and external ear normal.  Mouth/Throat: Oropharynx is clear and moist. No oropharyngeal exudate.  Cerumen deep to L ear canal covering TM - s/p irrigation then manual removal with plastic curette without full clearing  Eyes: Pupils are equal, round, and reactive to light. EOM are normal.  Neck: Carotid bruit is not present.  Cardiovascular: Normal rate and regular rhythm.  Murmur (mild systolic) heard. Pulmonary/Chest: Effort normal and breath sounds normal. No respiratory distress. She has no wheezes. She has no rales.  Musculoskeletal: She exhibits no edema.  Lymphadenopathy:    She has no cervical adenopathy.  Neurological: She is alert. She has normal strength. No cranial nerve deficit or sensory deficit. She displays a negative Romberg sign. Coordination and gait normal.  CN 2-12 intact FTN intact EOMI  No pronator drift  Skin: No rash noted.  Nursing note and vitals reviewed.  Results for orders placed or performed during the hospital encounter of 30/16/01  Basic metabolic panel  Result Value Ref Range   Sodium 136 135 - 145 mmol/L   Potassium 4.1 3.5 - 5.1 mmol/L   Chloride 98 98 - 111 mmol/L   CO2 28 22 - 32 mmol/L   Glucose, Bld 134 (H) 70 - 99 mg/dL   BUN 11 8 - 23 mg/dL   Creatinine, Ser 1.06 (H) 0.44 - 1.00 mg/dL   Calcium 10.2 8.9 - 10.3 mg/dL   GFR calc non Af Amer 53 (L) >60 mL/min   GFR calc Af Amer >60 >60 mL/min   Anion gap 10 5 - 15  CBC  Result Value Ref Range   WBC 4.8 4.0 - 10.5 K/uL   RBC 4.50 3.87 - 5.11 MIL/uL   Hemoglobin 13.3 12.0 - 15.0 g/dL   HCT 41.6 36.0 - 46.0 %   MCV 92.4 78.0 - 100.0 fL   MCH 29.6 26.0 - 34.0 pg   MCHC 32.0 30.0 - 36.0 g/dL   RDW 12.6 11.5 - 15.5 %   Platelets 248 150 - 400 K/uL  I-stat troponin, ED  Result Value Ref Range   Troponin i, poc 0.00 0.00 - 0.08 ng/mL   Comment 3          I-stat troponin, ED  Result  Value Ref Range   Troponin i, poc 0.00 0.00 - 0.08 ng/mL   Comment 3           Lab Results  Component Value Date   TSH 1.32 09/03/2017   Lab Results  Component Value Date   HGBA1C 5.9 09/03/2017       Assessment & Plan:   Problem List Items Addressed This Visit    Migraine    Reviewed cost through goodrx for prior topamax /maxalt Rx - very affordable. Actually some better over last  few months - will not start these medications at this time.      Left ear impacted cerumen    Incomplete clearance today despite irrigation and manual disimpaction - will refer to ENT for full cerumen removal.       Relevant Orders   EAR CERUMEN REMOVAL   Ambulatory referral to ENT   Fatigue    Longstanding. Recent labs unrevealing. Will add vitamin B12, D evaluation today - pt aware may not be covered by insurance.       Relevant Orders   Vitamin B12   VITAMIN D 25 Hydroxy (Vit-D Deficiency, Fractures)   Essential hypertension    Chronic, stable. Continue amlodipine and hctz.       Dizziness - Primary    Overall benign neurological exam. ?cerumen contribution - will refer to ENT for full clearing. Update with effect and if ongoing dizziness will further evaluate.           No orders of the defined types were placed in this encounter.  Orders Placed This Encounter  Procedures  . Vitamin B12  . VITAMIN D 25 Hydroxy (Vit-D Deficiency, Fractures)  . Ambulatory referral to ENT    Referral Priority:   Routine    Referral Type:   Consultation    Referral Reason:   Specialty Services Required    Requested Specialty:   Otolaryngology    Number of Visits Requested:   1  . EAR CERUMEN REMOVAL    Follow up plan: Return if symptoms worsen or fail to improve.  Ria Bush, MD

## 2018-02-06 NOTE — Assessment & Plan Note (Signed)
Reviewed cost through goodrx for prior topamax /maxalt Rx - very affordable. Actually some better over last few months - will not start these medications at this time.

## 2018-02-06 NOTE — Assessment & Plan Note (Addendum)
Chronic, stable. Continue amlodipine and hctz.

## 2018-02-06 NOTE — Assessment & Plan Note (Signed)
Incomplete clearance today despite irrigation and manual disimpaction - will refer to ENT for full cerumen removal.

## 2018-02-06 NOTE — Assessment & Plan Note (Addendum)
Longstanding. Recent labs unrevealing. Will add vitamin B12, D evaluation today - pt aware may not be covered by insurance.

## 2018-02-06 NOTE — Assessment & Plan Note (Signed)
Overall benign neurological exam. ?cerumen contribution - will refer to ENT for full clearing. Update with effect and if ongoing dizziness will further evaluate.

## 2018-02-06 NOTE — Patient Instructions (Signed)
Ears cleaned some today but not fully - we will refer you to ENT for wax removal on left side.  Labs today to check vitamins for energy levels.  Update [place] with how dizziness, fatigue does.

## 2018-02-07 ENCOUNTER — Other Ambulatory Visit: Payer: Self-pay | Admitting: Family Medicine

## 2018-02-07 DIAGNOSIS — E559 Vitamin D deficiency, unspecified: Secondary | ICD-10-CM | POA: Insufficient documentation

## 2018-02-07 MED ORDER — VITAMIN D3 25 MCG (1000 UT) PO CAPS: 1.0000 | ORAL_CAPSULE | Freq: Every day | ORAL | Status: DC

## 2018-02-12 DIAGNOSIS — H9012 Conductive hearing loss, unilateral, left ear, with unrestricted hearing on the contralateral side: Secondary | ICD-10-CM | POA: Insufficient documentation

## 2018-02-12 DIAGNOSIS — Z972 Presence of dental prosthetic device (complete) (partial): Secondary | ICD-10-CM | POA: Diagnosis not present

## 2018-02-12 DIAGNOSIS — R42 Dizziness and giddiness: Secondary | ICD-10-CM | POA: Diagnosis not present

## 2018-02-12 DIAGNOSIS — H9313 Tinnitus, bilateral: Secondary | ICD-10-CM | POA: Diagnosis not present

## 2018-02-12 DIAGNOSIS — Z87891 Personal history of nicotine dependence: Secondary | ICD-10-CM | POA: Diagnosis not present

## 2018-02-12 DIAGNOSIS — H938X3 Other specified disorders of ear, bilateral: Secondary | ICD-10-CM | POA: Diagnosis not present

## 2018-02-12 DIAGNOSIS — L299 Pruritus, unspecified: Secondary | ICD-10-CM | POA: Diagnosis not present

## 2018-02-12 DIAGNOSIS — H6122 Impacted cerumen, left ear: Secondary | ICD-10-CM | POA: Diagnosis not present

## 2018-02-13 ENCOUNTER — Other Ambulatory Visit: Payer: Self-pay | Admitting: Family Medicine

## 2018-02-13 DIAGNOSIS — R7989 Other specified abnormal findings of blood chemistry: Secondary | ICD-10-CM

## 2018-02-13 DIAGNOSIS — I10 Essential (primary) hypertension: Secondary | ICD-10-CM

## 2018-02-13 DIAGNOSIS — R7303 Prediabetes: Secondary | ICD-10-CM

## 2018-02-13 NOTE — Progress Notes (Signed)
Future orders put in for OV.

## 2018-02-21 ENCOUNTER — Other Ambulatory Visit (INDEPENDENT_AMBULATORY_CARE_PROVIDER_SITE_OTHER): Payer: Medicare Other

## 2018-02-21 DIAGNOSIS — R7303 Prediabetes: Secondary | ICD-10-CM | POA: Diagnosis not present

## 2018-02-21 DIAGNOSIS — R7989 Other specified abnormal findings of blood chemistry: Secondary | ICD-10-CM

## 2018-02-21 DIAGNOSIS — I10 Essential (primary) hypertension: Secondary | ICD-10-CM

## 2018-02-21 LAB — BASIC METABOLIC PANEL
BUN: 10 mg/dL (ref 6–23)
CO2: 31 mEq/L (ref 19–32)
CREATININE: 0.92 mg/dL (ref 0.40–1.20)
Calcium: 9.9 mg/dL (ref 8.4–10.5)
Chloride: 100 mEq/L (ref 96–112)
GFR: 78.5 mL/min (ref >60.00)
Glucose, Bld: 88 mg/dL (ref 70–99)
Potassium: 3.9 mEq/L (ref 3.5–5.1)
SODIUM: 139 meq/L (ref 135–145)

## 2018-02-21 LAB — HEMOGLOBIN A1C: Hgb A1c MFr Bld: 6 % (ref 4.6–6.5)

## 2018-02-25 ENCOUNTER — Encounter: Payer: Self-pay | Admitting: Family Medicine

## 2018-02-25 ENCOUNTER — Ambulatory Visit (INDEPENDENT_AMBULATORY_CARE_PROVIDER_SITE_OTHER): Payer: Medicare Other | Admitting: Family Medicine

## 2018-02-25 VITALS — BP 118/78 | HR 84 | Temp 98.2°F | Ht 60.25 in | Wt 179.5 lb

## 2018-02-25 DIAGNOSIS — Z0000 Encounter for general adult medical examination without abnormal findings: Secondary | ICD-10-CM | POA: Diagnosis not present

## 2018-02-25 DIAGNOSIS — G43909 Migraine, unspecified, not intractable, without status migrainosus: Secondary | ICD-10-CM | POA: Diagnosis not present

## 2018-02-25 DIAGNOSIS — E2839 Other primary ovarian failure: Secondary | ICD-10-CM | POA: Diagnosis not present

## 2018-02-25 DIAGNOSIS — Z23 Encounter for immunization: Secondary | ICD-10-CM | POA: Diagnosis not present

## 2018-02-25 MED ORDER — SUMATRIPTAN SUCCINATE 50 MG PO TABS: 50.0000 mg | ORAL_TABLET | ORAL | 0 refills | Status: AC | PRN

## 2018-02-25 MED ORDER — TOPIRAMATE 50 MG PO TABS: 50.0000 mg | ORAL_TABLET | Freq: Two times a day (BID) | ORAL | 1 refills | Status: AC

## 2018-02-25 MED ORDER — VITAMIN D3 1.25 MG (50000 UT) PO TABS: 1.0000 | ORAL_TABLET | ORAL | 3 refills | Status: DC

## 2018-02-25 NOTE — Patient Instructions (Addendum)
Please call and schedule an appointment for screening mammogram. A referral is not needed.  [place]- tell them you are also having a bone density study- the order is in   Follow up with your eye doctor   Ms. Widmann , Thank you for taking time to come for your Medicare Wellness Visit. I appreciate your ongoing commitment to your health goals. Please review the following plan we discussed and let me know if I can assist you in the future.   These are the goals we discussed: Increase stretching, work on decreasing juice and increasing vegetables   This is a list of the screening recommended for you and due dates:  Health Maintenance  Topic Date Due  .  Hepatitis C: One time screening is recommended by Center for Disease Control  (CDC) for  adults born from [birth year] through 1964.   04/12/1951  . Tetanus Vaccine  12/12/1970  . Mammogram  12/11/2001  . Colon Cancer Screening  12/11/2001  . DEXA scan (bone density measurement)  12/11/2016  . Pneumonia vaccines (1 of 2 - PCV13) 12/11/2016  . Flu Shot  08/27/2018*  *Topic was postponed. The date shown is not the original due date.

## 2018-02-25 NOTE — Progress Notes (Signed)
Subjective:    Joann Drake is a 67 y.o. female who presents for a Welcome to Medicare exam.   Review of Systems Headaches- continued migraines, unable to afford meds that were prescribed at visit 02/06/18 Right upper arm pain, several weeks, no known trauma/overuse, no limit of ROM, pain not in joint No chest pain, no SOB Sleep ok, fatigued in mornings         Objective:    Today's Vitals   02/25/18 1143  BP: 118/78  Pulse: 84  Temp: 98.2 F (36.8 C)  TempSrc: Oral  SpO2: 97%  Weight: 179 lb 8 oz (81.4 kg)  Height: 5' 0.25" (1.53 m)  Body mass index is 34.77 kg/m.  Medications Outpatient Encounter Medications as of 02/25/2018  Medication Sig  . amLODipine (NORVASC) 5 MG tablet Take 1 tablet (5 mg total) by mouth daily.  Marland Kitchen aspirin EC 81 MG tablet Take 81 mg by mouth daily as needed (pain).  Marland Kitchen aspirin-acetaminophen-caffeine (EXCEDRIN MIGRAINE) 250-250-65 MG tablet Take 2 tablets by mouth every 8 (eight) hours as needed for headache or migraine.  . Aspirin-Salicylamide-Caffeine (BC HEADACHE POWDER PO) Take 1-2 packets by mouth 2 (two) times daily as needed (migraine).  . Cholecalciferol (VITAMIN D3) 1000 units CAPS Take 1 capsule (1,000 Units total) by mouth daily.  . hydrochlorothiazide (HYDRODIURIL) 25 MG tablet Take 1 tablet (25 mg total) by mouth daily.  . methocarbamol (ROBAXIN) 500 MG tablet Take 1 tablet (500 mg total) by mouth 2 (two) times daily.  . mineral oil liquid See admin instructions. Instill 4-5 drops in to left ear as needed for wax buildup  . Multiple Vitamin (MULTIVITAMIN WITH MINERALS) TABS tablet Take 1 tablet by mouth daily.  . peppermint oil liquid Apply topically as needed (headache/migraine).   No facility-administered encounter medications on file as of 02/25/2018.      History: Past Medical History:  Diagnosis Date  . Allergy   . Arthritis   . Hypertension   . Migraine    Past Surgical History:  Procedure Laterality Date  . TOOTH  EXTRACTION    . TUBAL LIGATION      Family History  Problem Relation Age of Onset  . Asthma Mother   . Hypertension Mother   . Miscarriages / [place] Mother   . Stroke Mother   . Pneumonia Father   . Heart disease Sister   . Hypertension Sister   . Stroke Sister   . Kidney disease Daughter   . Early death Son   . Kidney disease Son    Social History   Occupational History  . Not on file  Tobacco Use  . Smoking status: Former Research scientist (life sciences)  . Smokeless tobacco: Never Used  Substance and Sexual Activity  . Alcohol use: No  . Drug use: No  . Sexual activity: Not Currently    Tobacco Counseling Has not smoked in many years.   Immunizations and Health Maintenance  There is no immunization history on file for this patient. Health Maintenance Due  Topic Date Due  . Hepatitis C Screening  1950-08-18  . TETANUS/TDAP  12/12/1970  . MAMMOGRAM  12/11/2001  . COLONOSCOPY  12/11/2001  . DEXA SCAN  12/11/2016  . PNA vac Low Risk Adult (1 of 2 - PCV13) 12/11/2016    Activities of Daily Living No difficulty performing any ADLs  Physical Exam   Overweight female in NAD Conjunctiva clear HR normal Respirations unlabored UE with normal ROM LE without edema Affect and behavior normal  BP 118/78 (BP Location: Right Arm, Patient Position: Sitting, Cuff Size: Large)   Pulse 84   Temp 98.2 F (36.8 C) (Oral)   Ht 5' 0.25" (1.53 m)   Wt 179 lb 8 oz (81.4 kg)   SpO2 97%   BMI 34.77 kg/m  Wt Readings from Last 3 Encounters:  02/25/18 179 lb 8 oz (81.4 kg)  02/06/18 180 lb (81.6 kg)  11/07/17 173 lb (78.5 kg)    Advanced Directives:  Interested in information    Assessment:    This is a routine wellness examination for this patient .   Vision/Hearing screen  Visual Acuity Screening   Right eye Left eye Both eyes  Without correction:     With correction: 20/100 20/25 20/25    Dietary issues and exercise activities discussed:     Decrease juice intake, increase  vegetables and fruits  Depression Screen PHQ 2/9 Scores 02/25/2018 09/03/2017  PHQ - 2 Score 0 0     Fall Risk Fall Risk  02/25/2018  Falls in the past year? No    Cognitive Function:  Alert and oriented x 3, no obvious deficits      Patient Care Team: Elby Beck, FNP as PCP - General (Nurse Practitioner)     Plan:    1. Migraine without status migrainosus, not intractable, unspecified migraine type - provided printed prescriptions and encouraged patient to ask about out of pocket expense and both medications should be very inexpensive - topiramate (TOPAMAX) 50 MG tablet; Take 1 tablet (50 mg total) by mouth 2 (two) times daily.  Dispense: 60 tablet; Refill: 1 - SUMAtriptan (IMITREX) 50 MG tablet; Take 1 tablet (50 mg total) by mouth every 2 (two) hours as needed for migraine. May repeat in 2 hours x 1 if needed.  Dispense: 10 tablet; Refill: 0  2. Estrogen deficiency - DG Bone Density; Future  3. Need for influenza vaccination - Flu vaccine HIGH DOSE PF - follow up in 6 months  Clarene Reamer, FNP-BC  [facility] [place], [place]  02/25/2018 8:29 PM  I have personally reviewed and noted the following in the patient's chart:   . Medical and social history . Use of alcohol, tobacco or illicit drugs  . Current medications and supplements . Functional ability and status . Nutritional status . Physical activity . Advanced directives . List of other physicians . Hospitalizations, surgeries, and ER visits in previous 12 months . Vitals . Screenings to include cognitive, depression, and falls . Referrals and appointments  In addition, I have reviewed and discussed with patient certain preventive protocols, quality metrics, and best practice recommendations. A written personalized care plan for preventive services as well as general preventive health recommendations were provided to patient. - Cologuard ordered - Mammo/dexa scan  ordered - encouraged her to get eye exam    Elby Beck, FNP 02/25/2018

## 2018-03-05 ENCOUNTER — Other Ambulatory Visit: Payer: Self-pay | Admitting: Family Medicine

## 2018-03-05 DIAGNOSIS — E2839 Other primary ovarian failure: Secondary | ICD-10-CM

## 2018-03-05 DIAGNOSIS — Z1231 Encounter for screening mammogram for malignant neoplasm of breast: Secondary | ICD-10-CM

## 2018-04-12 DIAGNOSIS — H25811 Combined forms of age-related cataract, right eye: Secondary | ICD-10-CM | POA: Diagnosis not present

## 2018-04-12 DIAGNOSIS — Z01818 Encounter for other preprocedural examination: Secondary | ICD-10-CM | POA: Diagnosis not present

## 2018-05-06 ENCOUNTER — Telehealth: Payer: Self-pay | Admitting: Family Medicine

## 2018-05-06 DIAGNOSIS — H25811 Combined forms of age-related cataract, right eye: Secondary | ICD-10-CM | POA: Diagnosis not present

## 2018-05-06 DIAGNOSIS — H2511 Age-related nuclear cataract, right eye: Secondary | ICD-10-CM | POA: Diagnosis not present

## 2018-05-06 NOTE — Telephone Encounter (Signed)
Please call patient and find out if she received her Cologuard testing kit? The company has notified us that it has not been returned. Please encourage her to return it.

## 2018-05-06 NOTE — Telephone Encounter (Signed)
Called and left VM for pt to return call to office.  

## 2018-05-07 ENCOUNTER — Ambulatory Visit
Admission: RE | Admit: 2018-05-07 | Discharge: 2018-05-07 | Disposition: A | Discharge status: Home / Self Care | Payer: Medicare Other | Source: Ambulatory Visit | Attending: Family Medicine | Admitting: Family Medicine

## 2018-05-07 DIAGNOSIS — Z13820 Encounter for screening for osteoporosis: Secondary | ICD-10-CM | POA: Diagnosis not present

## 2018-05-07 DIAGNOSIS — Z1231 Encounter for screening mammogram for malignant neoplasm of breast: Secondary | ICD-10-CM | POA: Diagnosis not present

## 2018-05-07 DIAGNOSIS — Z780 Asymptomatic menopausal state: Secondary | ICD-10-CM | POA: Diagnosis not present

## 2018-05-07 DIAGNOSIS — E2839 Other primary ovarian failure: Secondary | ICD-10-CM

## 2018-05-08 NOTE — Telephone Encounter (Signed)
Called and left VM for pt to return call to office.  

## 2018-05-13 ENCOUNTER — Other Ambulatory Visit: Payer: Self-pay | Admitting: Family Medicine

## 2018-05-13 DIAGNOSIS — R928 Other abnormal and inconclusive findings on diagnostic imaging of breast: Secondary | ICD-10-CM

## 2018-05-14 ENCOUNTER — Ambulatory Visit: Payer: Medicare Other | Admitting: Adult Health

## 2018-05-15 NOTE — Telephone Encounter (Signed)
Called and left VM for pt to return call to office.  

## 2018-05-20 DIAGNOSIS — H2521 Age-related cataract, morgagnian type, right eye: Secondary | ICD-10-CM | POA: Diagnosis not present

## 2018-05-20 DIAGNOSIS — H25812 Combined forms of age-related cataract, left eye: Secondary | ICD-10-CM | POA: Diagnosis not present

## 2018-05-24 ENCOUNTER — Other Ambulatory Visit: Payer: Self-pay | Admitting: Family Medicine

## 2018-05-24 ENCOUNTER — Ambulatory Visit
Admission: RE | Admit: 2018-05-24 | Discharge: 2018-05-24 | Disposition: A | Discharge status: Home / Self Care | Payer: Medicare Other | Source: Ambulatory Visit | Attending: Family Medicine | Admitting: Family Medicine

## 2018-05-24 DIAGNOSIS — R928 Other abnormal and inconclusive findings on diagnostic imaging of breast: Secondary | ICD-10-CM

## 2018-05-24 DIAGNOSIS — R921 Mammographic calcification found on diagnostic imaging of breast: Secondary | ICD-10-CM | POA: Diagnosis not present

## 2018-05-27 ENCOUNTER — Encounter: Payer: Self-pay | Admitting: *Deleted

## 2018-05-27 NOTE — Telephone Encounter (Signed)
Letter mailed

## 2018-06-10 ENCOUNTER — Inpatient Hospital Stay: Admission: RE | Admit: 2018-06-10 | Payer: Medicare Other | Source: Ambulatory Visit

## 2018-06-18 ENCOUNTER — Telehealth: Payer: Self-pay | Admitting: *Deleted

## 2018-06-18 ENCOUNTER — Inpatient Hospital Stay: Admission: RE | Admit: 2018-06-18 | Payer: Medicare Other | Source: Ambulatory Visit

## 2018-06-18 NOTE — Telephone Encounter (Signed)
Tye Maryland with the [facility] left a vm at triage indicating the above listed pt was seen in Dec and it was recommended that she have a biopsy. They are wanting to advise PCP that pt has [place] 2 appts. They are going to reach out to her to reschedule but wanted to advise DGessner, in the event she needed to update pts chart.

## 2018-06-18 NOTE — Telephone Encounter (Signed)
Noted, orders signed and approved for breast biopsy.

## 2018-06-28 NOTE — Telephone Encounter (Signed)
Left message on VM per DPR to call with an update.

## 2018-06-28 NOTE — Telephone Encounter (Signed)
Please call patient and ask her about breast biopsy? Per the breast center, she has no showed twice. Has she rescheduled? I don't see an upcoming appointment in the chart. Please find out if everything is ok.

## 2018-07-08 NOTE — Telephone Encounter (Signed)
attempted to reach pt, no answer left vm to call back

## 2018-07-12 NOTE — Telephone Encounter (Signed)
Left message on voicemail for patient to call the office back. 

## 2018-07-15 ENCOUNTER — Encounter: Payer: Self-pay | Admitting: *Deleted

## 2018-07-15 NOTE — Telephone Encounter (Signed)
Letter mailed

## 2018-07-25 ENCOUNTER — Other Ambulatory Visit: Payer: Self-pay | Admitting: Family Medicine

## 2018-07-25 DIAGNOSIS — R928 Other abnormal and inconclusive findings on diagnostic imaging of breast: Secondary | ICD-10-CM

## 2018-07-29 ENCOUNTER — Ambulatory Visit
Admission: RE | Admit: 2018-07-29 | Discharge: 2018-07-29 | Disposition: A | Discharge status: Home / Self Care | Payer: Medicare Other | Source: Ambulatory Visit | Attending: Family Medicine | Admitting: Family Medicine

## 2018-07-29 DIAGNOSIS — R921 Mammographic calcification found on diagnostic imaging of breast: Secondary | ICD-10-CM | POA: Diagnosis not present

## 2018-07-29 DIAGNOSIS — D242 Benign neoplasm of left breast: Secondary | ICD-10-CM | POA: Diagnosis not present

## 2018-07-29 DIAGNOSIS — R928 Other abnormal and inconclusive findings on diagnostic imaging of breast: Secondary | ICD-10-CM

## 2018-08-15 ENCOUNTER — Other Ambulatory Visit: Payer: Self-pay

## 2018-08-15 DIAGNOSIS — I10 Essential (primary) hypertension: Secondary | ICD-10-CM

## 2018-08-15 MED ORDER — HYDROCHLOROTHIAZIDE 25 MG PO TABS: 25.0000 mg | ORAL_TABLET | Freq: Every day | ORAL | 3 refills | Status: DC

## 2018-08-15 MED ORDER — AMLODIPINE BESYLATE 5 MG PO TABS: 5.0000 mg | ORAL_TABLET | Freq: Every day | ORAL | 3 refills | Status: DC

## 2019-01-30 ENCOUNTER — Other Ambulatory Visit: Payer: Self-pay | Admitting: Family Medicine

## 2019-02-25 ENCOUNTER — Telehealth: Payer: Self-pay

## 2019-02-25 ENCOUNTER — Other Ambulatory Visit: Payer: Self-pay | Admitting: Family Medicine

## 2019-02-25 DIAGNOSIS — E7841 Elevated Lipoprotein(a): Secondary | ICD-10-CM

## 2019-02-25 DIAGNOSIS — I10 Essential (primary) hypertension: Secondary | ICD-10-CM

## 2019-02-25 DIAGNOSIS — E559 Vitamin D deficiency, unspecified: Secondary | ICD-10-CM

## 2019-02-25 DIAGNOSIS — R7303 Prediabetes: Secondary | ICD-10-CM

## 2019-02-25 NOTE — Telephone Encounter (Signed)
Left detailed VM w COVID screen and back door lab info and front door

## 2019-02-27 ENCOUNTER — Other Ambulatory Visit: Payer: Medicare Other

## 2019-02-28 ENCOUNTER — Other Ambulatory Visit (INDEPENDENT_AMBULATORY_CARE_PROVIDER_SITE_OTHER): Payer: Medicare Other

## 2019-02-28 ENCOUNTER — Ambulatory Visit (INDEPENDENT_AMBULATORY_CARE_PROVIDER_SITE_OTHER): Payer: Medicare Other

## 2019-02-28 ENCOUNTER — Other Ambulatory Visit: Payer: Self-pay

## 2019-02-28 ENCOUNTER — Ambulatory Visit: Payer: Medicare Other

## 2019-02-28 DIAGNOSIS — E559 Vitamin D deficiency, unspecified: Secondary | ICD-10-CM

## 2019-02-28 DIAGNOSIS — E7841 Elevated Lipoprotein(a): Secondary | ICD-10-CM

## 2019-02-28 DIAGNOSIS — Z0000 Encounter for general adult medical examination without abnormal findings: Secondary | ICD-10-CM

## 2019-02-28 DIAGNOSIS — R7303 Prediabetes: Secondary | ICD-10-CM

## 2019-02-28 DIAGNOSIS — I10 Essential (primary) hypertension: Secondary | ICD-10-CM

## 2019-02-28 LAB — COMPREHENSIVE METABOLIC PANEL
ALT: 17 U/L (ref 0–35)
AST: 19 U/L (ref 0–37)
Albumin: 4.3 g/dL (ref 3.5–5.2)
Alkaline Phosphatase: 82 U/L (ref 39–117)
BUN: 11 mg/dL (ref 6–23)
CO2: 32 mEq/L (ref 19–32)
Calcium: 10.3 mg/dL (ref 8.4–10.5)
Chloride: 99 mEq/L (ref 96–112)
Creatinine, Ser: 0.92 mg/dL (ref 0.40–1.20)
GFR: 73.63 mL/min (ref >60.00)
Glucose, Bld: 98 mg/dL (ref 70–99)
Potassium: 3.8 mEq/L (ref 3.5–5.1)
Sodium: 140 mEq/L (ref 135–145)
Total Bilirubin: 0.5 mg/dL (ref 0.2–1.2)
Total Protein: 7.8 g/dL (ref 6.0–8.3)

## 2019-02-28 LAB — CBC WITH DIFFERENTIAL/PLATELET
Basophils Absolute: 0 10*3/uL (ref 0.0–0.1)
Basophils Relative: 0.5 % (ref 0.0–3.0)
Eosinophils Absolute: 0.2 10*3/uL (ref 0.0–0.7)
Eosinophils Relative: 3.3 % (ref 0.0–5.0)
HCT: 38.1 % (ref 36.0–46.0)
Hemoglobin: 12.8 g/dL (ref 12.0–15.0)
Lymphocytes Relative: 45.2 % (ref 12.0–46.0)
Lymphs Abs: 2.1 10*3/uL (ref 0.7–4.0)
MCHC: 33.5 g/dL (ref 30.0–36.0)
MCV: 91.1 fl (ref 78.0–100.0)
Monocytes Absolute: 0.5 10*3/uL (ref 0.1–1.0)
Monocytes Relative: 10.7 % (ref 3.0–12.0)
Neutro Abs: 1.9 10*3/uL (ref 1.4–7.7)
Neutrophils Relative %: 40.3 % — ABNORMAL LOW (ref 43.0–77.0)
Platelets: 254 10*3/uL (ref 150.0–400.0)
RBC: 4.18 Mil/uL (ref 3.87–5.11)
RDW: 13.3 % (ref 11.5–15.5)
WBC: 4.6 10*3/uL (ref 4.0–10.5)

## 2019-02-28 LAB — LIPID PANEL
Cholesterol: 234 mg/dL — ABNORMAL HIGH (ref 0–200)
HDL: 84.6 mg/dL (ref >39.00)
LDL Cholesterol: 131 mg/dL — ABNORMAL HIGH (ref 0–99)
NonHDL: 149.52
Total CHOL/HDL Ratio: 3
Triglycerides: 92 mg/dL (ref 0.0–149.0)
VLDL: 18.4 mg/dL (ref 0.0–40.0)

## 2019-02-28 LAB — VITAMIN D 25 HYDROXY (VIT D DEFICIENCY, FRACTURES): VITD: 81.65 ng/mL (ref 30.00–100.00)

## 2019-02-28 LAB — HEMOGLOBIN A1C: Hgb A1c MFr Bld: 6.1 % (ref 4.6–6.5)

## 2019-02-28 NOTE — Progress Notes (Signed)
PCP notes: none  Health Maintenance: Patient will get flu vaccine at office visit. Patient declined colonoscopy, pneumococcal, Tdap and Shingrix.     Abnormal Screenings: none    Patient concerns: Patient states that she has been having numbness in her right foot the radiates up her right leg. Onset 1 month ago.     Nurse concerns: none    Next PCP appt.: 03/03/2019 @ 10:30 am

## 2019-02-28 NOTE — Patient Instructions (Signed)
[name] , Thank you for taking time to come for your Medicare Wellness Visit. I appreciate your ongoing commitment to your health goals. Please review the following plan we discussed and let me know if I can assist you in the future.   Screening recommendations/referrals: Colonoscopy: declined Mammogram: up to date, completed 07/29/2018 Bone Density: up to date, completed 05/07/2018 Recommended yearly ophthalmology/optometry visit for glaucoma screening and checkup Recommended yearly dental visit for hygiene and checkup  Vaccinations: Influenza vaccine: will get at office visit Pneumococcal vaccine: declined Tdap vaccine: declined Shingles vaccine: declined    Advanced directives: Advance directive discussed with you today. Even though you declined this today please call our office should you change your mind and we can give you the proper paperwork for you to fill out.  Conditions/risks identified: hypertension  Next appointment: 03/03/2019 @ 10:30 am    Preventive Care 65 Years and Older, Female Preventive care refers to lifestyle choices and visits with your health care provider that can promote health and wellness. What does preventive care include?  A yearly physical exam. This is also called an annual well check.  Dental exams once or twice a year.  Routine eye exams. Ask your health care provider how often you should have your eyes checked.  Personal lifestyle choices, including:  Daily care of your teeth and gums.  Regular physical activity.  Eating a healthy diet.  Avoiding tobacco and drug use.  Limiting alcohol use.  Practicing safe sex.  Taking low-dose aspirin every day.  Taking vitamin and mineral supplements as recommended by your health care provider. What happens during an annual well check? The services and screenings done by your health care provider during your annual well check will depend on your age, overall health, lifestyle risk factors,  and family history of disease. Counseling  Your health care provider may ask you questions about your:  Alcohol use.  Tobacco use.  Drug use.  Emotional well-being.  Home and relationship well-being.  Sexual activity.  Eating habits.  History of falls.  Memory and ability to understand (cognition).  Work and work Statistician.  Reproductive health. Screening  You may have the following tests or measurements:  Height, weight, and BMI.  Blood pressure.  Lipid and cholesterol levels. These may be checked every 5 years, or more frequently if you are over 63 years old.  Skin check.  Lung cancer screening. You may have this screening every year starting at age 86 if you have a 30-pack-year history of smoking and currently smoke or have quit within the past 15 years.  Fecal occult blood test (FOBT) of the stool. You may have this test every year starting at age 85.  Flexible sigmoidoscopy or colonoscopy. You may have a sigmoidoscopy every 5 years or a colonoscopy every 10 years starting at age 83.  Hepatitis C blood test.  Hepatitis B blood test.  Sexually transmitted disease (STD) testing.  Diabetes screening. This is done by checking your blood sugar (glucose) after you have not eaten for a while (fasting). You may have this done every 1-3 years.  Bone density scan. This is done to screen for osteoporosis. You may have this done starting at age 69.  Mammogram. This may be done every 1-2 years. Talk to your health care provider about how often you should have regular mammograms. Talk with your health care provider about your test results, treatment options, and if necessary, the need for more tests. Vaccines  Your health care provider  may recommend certain vaccines, such as:  Influenza vaccine. This is recommended every year.  Tetanus, diphtheria, and acellular pertussis (Tdap, Td) vaccine. You may need a Td booster every 10 years.  Zoster vaccine. You may need  this after age 24.  Pneumococcal 13-valent conjugate (PCV13) vaccine. One dose is recommended after age 61.  Pneumococcal polysaccharide (PPSV23) vaccine. One dose is recommended after age 67. Talk to your health care provider about which screenings and vaccines you need and how often you need them. This information is not intended to replace advice given to you by your health care provider. Make sure you discuss any questions you have with your health care provider. Document Released: 06/11/2015 Document Revised: 02/02/2016 Document Reviewed: 03/16/2015 Elsevier Interactive Patient Education  2017 [employer] Prevention in the Home Falls can cause injuries. They can happen to people of all ages. There are many things you can do to make your home safe and to help prevent falls. What can I do on the outside of my home?  Regularly fix the edges of walkways and driveways and fix any cracks.  Remove anything that might make you trip as you walk through a door, such as a raised step or threshold.  Trim any bushes or trees on the path to your home.  Use bright outdoor lighting.  Clear any walking paths of anything that might make someone trip, such as rocks or tools.  Regularly check to see if handrails are loose or broken. Make sure that both sides of any steps have handrails.  Any raised decks and porches should have guardrails on the edges.  Have any leaves, snow, or ice cleared regularly.  Use sand or salt on walking paths during winter.  Clean up any spills in your garage right away. This includes oil or grease spills. What can I do in the bathroom?  Use night lights.  Install grab bars by the toilet and in the tub and shower. Do not use towel bars as grab bars.  Use non-skid mats or decals in the tub or shower.  If you need to sit down in the shower, use a plastic, non-slip stool.  Keep the floor dry. Clean up any water that spills on the floor as soon as it  happens.  Remove soap buildup in the tub or shower regularly.  Attach bath mats securely with double-sided non-slip rug tape.  Do not have throw rugs and other things on the floor that can make you trip. What can I do in the bedroom?  Use night lights.  Make sure that you have a light by your bed that is easy to reach.  Do not use any sheets or blankets that are too big for your bed. They should not hang down onto the floor.  Have a firm chair that has side arms. You can use this for support while you get dressed.  Do not have throw rugs and other things on the floor that can make you trip. What can I do in the kitchen?  Clean up any spills right away.  Avoid walking on wet floors.  Keep items that you use a lot in easy-to-reach places.  If you need to reach something above you, use a strong step stool that has a grab bar.  Keep electrical cords out of the way.  Do not use floor polish or wax that makes floors slippery. If you must use wax, use non-skid floor wax.  Do not have throw rugs  and other things on the floor that can make you trip. What can I do with my stairs?  Do not leave any items on the stairs.  Make sure that there are handrails on both sides of the stairs and use them. Fix handrails that are broken or loose. Make sure that handrails are as long as the stairways.  Check any carpeting to make sure that it is firmly attached to the stairs. Fix any carpet that is loose or worn.  Avoid having throw rugs at the top or bottom of the stairs. If you do have throw rugs, attach them to the floor with carpet tape.  Make sure that you have a light switch at the top of the stairs and the bottom of the stairs. If you do not have them, ask someone to add them for you. What else can I do to help prevent falls?  Wear shoes that:  Do not have high heels.  Have rubber bottoms.  Are comfortable and fit you well.  Are closed at the toe. Do not wear sandals.  If you  use a stepladder:  Make sure that it is fully opened. Do not climb a closed stepladder.  Make sure that both sides of the stepladder are locked into place.  Ask someone to hold it for you, if possible.  Clearly mark and make sure that you can see:  Any grab bars or handrails.  First and last steps.  Where the edge of each step is.  Use tools that help you move around (mobility aids) if they are needed. These include:  Canes.  Walkers.  Scooters.  Crutches.  Turn on the lights when you go into a dark area. Replace any light bulbs as soon as they burn out.  Set up your furniture so you have a clear path. Avoid moving your furniture around.  If any of your floors are uneven, fix them.  If there are any pets around you, be aware of where they are.  Review your medicines with your doctor. Some medicines can make you feel dizzy. This can increase your chance of falling. Ask your doctor what other things that you can do to help prevent falls. This information is not intended to replace advice given to you by your health care provider. Make sure you discuss any questions you have with your health care provider. Document Released: 03/11/2009 Document Revised: 10/21/2015 Document Reviewed: 06/19/2014 Elsevier Interactive Patient Education  2017 [employer].

## 2019-02-28 NOTE — Progress Notes (Signed)
Subjective:   Jahayra Elysia Eben is a 68 y.o. female who presents for Medicare Annual (Subsequent) preventive examination.  Review of Systems:    This visit is being conducted through telemedicine via telephone at the nurse health advisor's home address due to the COVID-19 pandemic. This patient has given me verbal consent via doximity to conduct this visit, patient states they are participating from their home address. Some vital signs may be absent or patient reported.    Patient identification: identified by name, DOB, and current address  Cardiac Risk Factors include: advanced age (>36men, >32 women);hypertension;sedentary lifestyle     Objective:     Vitals: There were no vitals taken for this visit.  There is no height or weight on file to calculate BMI.  Advanced Directives 02/28/2019 08/13/2017  Does Patient Have a Medical Advance Directive? No No  Would patient like information on creating a medical advance directive? No - Patient declined -    Tobacco Social History   Tobacco Use  Smoking Status Former Smoker  Smokeless Tobacco Never Used     Counseling given: Not Answered   Clinical Intake:  Pre-visit preparation completed: Yes  Pain : No/denies pain     Nutritional Risks: None Diabetes: No  How often do you need to have someone help you when you read instructions, pamphlets, or other written materials from your doctor or pharmacy?: 1 - Never What is the last grade level you completed in school?: 12th  Interpreter Needed?: No  Information entered by :: CJohnson LPN  Past Medical History:  Diagnosis Date  . Allergy   . Arthritis   . Hypertension   . Migraine    Past Surgical History:  Procedure Laterality Date  . TOOTH EXTRACTION    . TUBAL LIGATION     Family History  Problem Relation Age of Onset  . Asthma Mother   . Hypertension Mother   . Miscarriages / [place] Mother   . Stroke Mother   . Pneumonia Father   . Heart disease  Sister   . Hypertension Sister   . Stroke Sister   . Kidney disease Daughter   . Early death Son   . Kidney disease Son    Social History   Socioeconomic History  . Marital status: Married    Spouse name: Not on file  . Number of children: Not on file  . Years of education: Not on file  . Highest education level: Not on file  Occupational History  . Not on file  Social Needs  . Financial resource strain: Not hard at all  . Food insecurity    Worry: Never true    Inability: Never true  . Transportation needs    Medical: No    Non-medical: No  Tobacco Use  . Smoking status: Former Research scientist (life sciences)  . Smokeless tobacco: Never Used  Substance and Sexual Activity  . Alcohol use: No  . Drug use: No  . Sexual activity: Not Currently  Lifestyle  . Physical activity    Days per week: 0 days    Minutes per session: 0 min  . Stress: Only a little  Relationships  . Social Herbalist on phone: Not on file    Gets together: Not on file    Attends religious service: Not on file    Active member of club or organization: Not on file    Attends meetings of clubs or organizations: Not on file    Relationship status:  Not on file  Other Topics Concern  . Not on file  Social History Narrative   Married   Education- college   Children - 3 living    Outpatient Encounter Medications as of 02/28/2019  Medication Sig  . amLODipine (NORVASC) 5 MG tablet Take 1 tablet (5 mg total) by mouth daily.  Marland Kitchen aspirin EC 81 MG tablet Take 81 mg by mouth daily as needed (pain).  Marland Kitchen aspirin-acetaminophen-caffeine (EXCEDRIN MIGRAINE) 250-250-65 MG tablet Take 2 tablets by mouth every 8 (eight) hours as needed for headache or migraine.  . Aspirin-Salicylamide-Caffeine (BC HEADACHE POWDER PO) Take 1-2 packets by mouth 2 (two) times daily as needed (migraine).  . Cholecalciferol (VITAMIN D3) 1.25 MG (50000 UT) CAPS TAKE 1 CAPSULE BY MOUTH EVERY 7 (SEVEN) DAYS.  . hydrochlorothiazide (HYDRODIURIL) 25 MG  tablet Take 1 tablet (25 mg total) by mouth daily.  . methocarbamol (ROBAXIN) 500 MG tablet Take 1 tablet (500 mg total) by mouth 2 (two) times daily.  . mineral oil liquid See admin instructions. Instill 4-5 drops in to left ear as needed for wax buildup  . Multiple Vitamin (MULTIVITAMIN WITH MINERALS) TABS tablet Take 1 tablet by mouth daily.  . peppermint oil liquid Apply topically as needed (headache/migraine).  . SUMAtriptan (IMITREX) 50 MG tablet Take 1 tablet (50 mg total) by mouth every 2 (two) hours as needed for migraine. May repeat in 2 hours x 1 if needed.  . topiramate (TOPAMAX) 50 MG tablet Take 1 tablet (50 mg total) by mouth 2 (two) times daily.   No facility-administered encounter medications on file as of 02/28/2019.     Activities of Daily Living In your present state of health, do you have any difficulty performing the following activities: 02/28/2019  Hearing? N  Vision? N  Difficulty concentrating or making decisions? N  Walking or climbing stairs? Y  Comment gets tired sometimes  Dressing or bathing? N  Doing errands, shopping? N  Preparing Food and eating ? N  Using the Toilet? N  In the past six months, have you accidently leaked urine? N  Do you have problems with loss of bowel control? N  Managing your Medications? N  Managing your Finances? N  Housekeeping or managing your Housekeeping? N  Some recent data might be hidden    Patient Care Team: Elby Beck, FNP as PCP - General (Nurse Practitioner)    Assessment:   This is a routine wellness examination for Ayaka.  Exercise Activities and Dietary recommendations Current Exercise Habits: The patient does not participate in regular exercise at present, Exercise limited by: orthopedic condition(s)  Goals    . Patient Stated     02/28/2019, Patient states that she wants to lose some weight soon.        Fall Risk Fall Risk  02/28/2019 02/25/2018  Falls in the past year? 1 No  Comment fell out  of a swing and fell down some stairs. -  Number falls in past yr: 1 -  Injury with Fall? 0 -  Risk for fall due to : History of fall(s);Medication side effect;Impaired balance/gait -  Follow up Falls evaluation completed;Falls prevention discussed -   Is the patient's home free of loose throw rugs in walkways, pet beds, electrical cords, etc?   yes      Grab bars in the bathroom? no      Handrails on the stairs?   yes      Adequate lighting?   yes  Timed  Get Up and Go performed: n/a  Depression Screen PHQ 2/9 Scores 02/28/2019 02/25/2018 09/03/2017  PHQ - 2 Score 0 0 0  PHQ- 9 Score 0 - -     Cognitive Function MMSE - Mini Mental State Exam 02/28/2019  Orientation to time 5  Orientation to Place 5  Registration 3  Attention/ Calculation 5  Recall 3  Language- repeat 1     Mini Cog  Mini-Cog screen was completed. Maximum score is 22. A value of 0 denotes this part of the MMSE was not completed or the patient failed this part of the Mini-Cog screening.    Immunization History  Administered Date(s) Administered  . Influenza, High Dose Seasonal PF 02/25/2018    Qualifies for Shingles Vaccine? yes  Screening Tests Health Maintenance  Topic Date Due  . Hepatitis C Screening  14-Nov-1950  . INFLUENZA VACCINE  12/28/2018  . COLONOSCOPY  02/28/2020 (Originally 12/11/2001)  . TETANUS/TDAP  02/28/2020 (Originally 12/12/1970)  . PNA vac Low Risk Adult (1 of 2 - PCV13) 02/28/2020 (Originally 12/11/2016)  . MAMMOGRAM  05/24/2020  . DEXA SCAN  Completed    Cancer Screenings: Lung: Low Dose CT Chest recommended if Age 68-80 years, 30 pack-year currently smoking OR have quit w/in 15years. Patient does not qualify. Breast:  Up to date on Mammogram? Yes   Up to date of Bone Density/Dexa? Yes Colorectal: declined  Additional Screenings:  Hepatitis C Screening: will discuss with provider     Plan:    Patient wants to start trying to lose weight very soon.  I have personally  reviewed and noted the following in the patient's chart:   . Medical and social history . Use of alcohol, tobacco or illicit drugs  . Current medications and supplements . Functional ability and status . Nutritional status . Physical activity . Advanced directives . List of other physicians . Hospitalizations, surgeries, and ER visits in previous 12 months . Vitals . Screenings to include cognitive, depression, and falls . Referrals and appointments  In addition, I have reviewed and discussed with patient certain preventive protocols, quality metrics, and best practice recommendations. A written personalized care plan for preventive services as well as general preventive health recommendations were provided to patient.     Andrez Grime, LPN  [phone]

## 2019-03-03 ENCOUNTER — Encounter: Payer: Medicare Other | Admitting: Family Medicine

## 2019-03-03 DIAGNOSIS — Z0289 Encounter for other administrative examinations: Secondary | ICD-10-CM

## 2019-03-10 ENCOUNTER — Other Ambulatory Visit: Payer: Self-pay

## 2019-03-10 ENCOUNTER — Ambulatory Visit (INDEPENDENT_AMBULATORY_CARE_PROVIDER_SITE_OTHER): Payer: Medicare Other | Admitting: Family Medicine

## 2019-03-10 ENCOUNTER — Encounter: Payer: Self-pay | Admitting: Family Medicine

## 2019-03-10 VITALS — BP 130/78 | HR 105 | Temp 97.2°F | Ht 60.25 in | Wt 191.8 lb

## 2019-03-10 DIAGNOSIS — M62838 Other muscle spasm: Secondary | ICD-10-CM

## 2019-03-10 DIAGNOSIS — Z23 Encounter for immunization: Secondary | ICD-10-CM | POA: Diagnosis not present

## 2019-03-10 DIAGNOSIS — M25559 Pain in unspecified hip: Secondary | ICD-10-CM | POA: Diagnosis not present

## 2019-03-10 DIAGNOSIS — R200 Anesthesia of skin: Secondary | ICD-10-CM | POA: Diagnosis not present

## 2019-03-10 DIAGNOSIS — R202 Paresthesia of skin: Secondary | ICD-10-CM

## 2019-03-10 DIAGNOSIS — R7303 Prediabetes: Secondary | ICD-10-CM | POA: Diagnosis not present

## 2019-03-10 DIAGNOSIS — I10 Essential (primary) hypertension: Secondary | ICD-10-CM | POA: Diagnosis not present

## 2019-03-10 DIAGNOSIS — Z6837 Body mass index (BMI) 37.0-37.9, adult: Secondary | ICD-10-CM | POA: Diagnosis not present

## 2019-03-10 MED ORDER — TIZANIDINE HCL 4 MG PO CAPS: 4.0000 mg | ORAL_CAPSULE | Freq: Every evening | ORAL | 0 refills | Status: DC | PRN

## 2019-03-10 MED ORDER — MELOXICAM 7.5 MG PO TABS: 7.5000 mg | ORAL_TABLET | Freq: Every day | ORAL | 0 refills | Status: DC

## 2019-03-10 NOTE — Patient Instructions (Addendum)
Good to see you today  Decrease your sugars and carbs- sweetened coffee creamer, juice drinks. Eat lean proteins, lots of veggies, whole grains (potato, sweet potato, beans- 1/2 cup per meal) Look at [email or web address].com/diabetes  I have sent in a muscle relaxer- tizanadine for your shoulder spasms (take at bedtime as needed) and a daily anti-inflammatory for your hip pain/leg numbness- take daily for 5-7 days then daily as needed  Follow up if not better in 1 week- call and let me know.   Please follow up your weight and prediabetes in 6 months.    Back Exercises These exercises help to make your trunk and back strong. They also help to keep the lower back flexible. Doing these exercises can help to prevent back pain or lessen existing pain.  If you have back pain, try to do these exercises 2-3 times each day or as told by your doctor.  As you get better, do the exercises once each day. Repeat the exercises more often as told by your doctor.  To stop back pain from coming back, do the exercises once each day, or as told by your doctor. Exercises Single knee to chest Do these steps 3-5 times in a row for each leg: 1. Lie on your back on a firm bed or the floor with your legs stretched out. 2. Bring one knee to your chest. 3. Grab your knee or thigh with both hands and hold them it in place. 4. Pull on your knee until you feel a gentle stretch in your lower back or buttocks. 5. Keep doing the stretch for 10-30 seconds. 6. Slowly let go of your leg and straighten it. Pelvic tilt Do these steps 5-10 times in a row: 1. Lie on your back on a firm bed or the floor with your legs stretched out. 2. Bend your knees so they point up to the ceiling. Your feet should be flat on the floor. 3. Tighten your lower belly (abdomen) muscles to press your lower back against the floor. This will make your tailbone point up to the ceiling instead of pointing down to your feet or the floor. 4. Stay in this  position for 5-10 seconds while you gently tighten your muscles and breathe evenly. Cat-cow Do these steps until your lower back bends more easily: 1. Get on your hands and knees on a firm surface. Keep your hands under your shoulders, and keep your knees under your hips. You may put padding under your knees. 2. Let your head hang down toward your chest. Tighten (contract) the muscles in your belly. Point your tailbone toward the floor so your lower back becomes rounded like the back of a cat. 3. Stay in this position for 5 seconds. 4. Slowly lift your head. Let the muscles of your belly relax. Point your tailbone up toward the ceiling so your back forms a sagging arch like the back of a cow. 5. Stay in this position for 5 seconds.  Press-ups Do these steps 5-10 times in a row: 1. Lie on your belly (face-down) on the floor. 2. Place your hands near your head, about shoulder-width apart. 3. While you keep your back relaxed and keep your hips on the floor, slowly straighten your arms to raise the top half of your body and lift your shoulders. Do not use your back muscles. You may change where you place your hands in order to make yourself more comfortable. 4. Stay in this position for 5 seconds. 5. Slowly return  to lying flat on the floor.  Bridges Do these steps 10 times in a row: 1. Lie on your back on a firm surface. 2. Bend your knees so they point up to the ceiling. Your feet should be flat on the floor. Your arms should be flat at your sides, next to your body. 3. Tighten your butt muscles and lift your butt off the floor until your waist is almost as high as your knees. If you do not feel the muscles working in your butt and the back of your thighs, slide your feet 1-2 inches farther away from your butt. 4. Stay in this position for 3-5 seconds. 5. Slowly lower your butt to the floor, and let your butt muscles relax. If this exercise is too easy, try doing it with your arms crossed over  your chest. Belly crunches Do these steps 5-10 times in a row: 1. Lie on your back on a firm bed or the floor with your legs stretched out. 2. Bend your knees so they point up to the ceiling. Your feet should be flat on the floor. 3. Cross your arms over your chest. 4. Tip your chin a little bit toward your chest but do not bend your neck. 5. Tighten your belly muscles and slowly raise your chest just enough to lift your shoulder blades a tiny bit off of the floor. Avoid raising your body higher than that, because it can put too much stress on your low back. 6. Slowly lower your chest and your head to the floor. Back lifts Do these steps 5-10 times in a row: 1. Lie on your belly (face-down) with your arms at your sides, and rest your forehead on the floor. 2. Tighten the muscles in your legs and your butt. 3. Slowly lift your chest off of the floor while you keep your hips on the floor. Keep the back of your head in line with the curve in your back. Look at the floor while you do this. 4. Stay in this position for 3-5 seconds. 5. Slowly lower your chest and your face to the floor. Contact a doctor if:  Your back pain gets a lot worse when you do an exercise.  Your back pain does not get better 2 hours after you exercise. If you have any of these problems, stop doing the exercises. Do not do them again unless your doctor says it is okay. Get help right away if:  You have sudden, very bad back pain. If this happens, stop doing the exercises. Do not do them again unless your doctor says it is okay. This information is not intended to replace advice given to you by your health care provider. Make sure you discuss any questions you have with your health care provider. Document Released: 06/17/2010 Document Revised: 02/07/2018 Document Reviewed: 02/07/2018 Elsevier Patient Education  2020 [employer].

## 2019-03-10 NOTE — Progress Notes (Signed)
Subjective:    Patient ID: Joann Drake, female    DOB: [birth year], 68 y.o.   MRN: [id number]  HPI Chief Complaint  Patient presents with  . Medicare Wellness    C/o numbness in hip to feet / Shoulder pain / Unsteady on feet    This is a 68 yo female who presents today for follow up of chronic medical conditions. And above concerns. Has been doing ok. Continues to live with her daughter and son in law and take care of her 23,5 yo grandchildren so their parents can work. She had Medicare AWV and labs prior to today's visit.   Numbness- hips into groin, down legs, R>L. Approximately a month. Feels numb/tingly like it has "gone to sleep", no weakness. Had an injury where she fell out of a swing, but numbness was before then. Usually occurs once a day with standing or walking for prolonged periods of time. No bowel/bladder incontinence.   Left shoulder pain off and on, spasms, worse at night. No known injury. No weakness, numbness or tingling.   Obesity- increased weight. "I eat, but I don't eat enough." Usually eats 2 meals a day. Rare soda, sweet tea, drinks 2-3 glasses of juice/juice drink a day. Breakfast- eggs, grits, bacon, coffee with sweetened creamer. Dinner- meat and vegetable.    Past Medical History:  Diagnosis Date  . Allergy   . Arthritis   . Hypertension   . Migraine    Past Surgical History:  Procedure Laterality Date  . TOOTH EXTRACTION    . TUBAL LIGATION     Family History  Problem Relation Age of Onset  . Asthma Mother   . Hypertension Mother   . Miscarriages / [place] Mother   . Stroke Mother   . Pneumonia Father   . Heart disease Sister   . Hypertension Sister   . Stroke Sister   . Kidney disease Daughter   . Early death Son   . Kidney disease Son    Social History   Tobacco Use  . Smoking status: Former Research scientist (life sciences)  . Smokeless tobacco: Never Used  Substance Use Topics  . Alcohol use: No  . Drug use: No      Review of Systems Per  HPI. Denies chest pain, SOB, abdominal pain, nausea/vomiting, diarrhea/constipation. No leg swelling.     Objective:   Physical Exam Vitals signs reviewed.  Constitutional:      Appearance: Normal appearance. She is obese.  HENT:     Head: Normocephalic and atraumatic.  Eyes:     Conjunctiva/sclera: Conjunctivae normal.  Neck:     Musculoskeletal: Normal range of motion and neck supple. No neck rigidity or muscular tenderness.  Cardiovascular:     Rate and Rhythm: Normal rate and regular rhythm.     Heart sounds: Normal heart sounds.  Pulmonary:     Effort: Pulmonary effort is normal.     Breath sounds: Normal breath sounds.  Chest:     Breasts: Breasts are symmetrical.        Right: Inverted nipple present. No swelling, bleeding, mass, nipple discharge, skin change or tenderness.        Left: Inverted nipple present. No swelling, bleeding, mass, nipple discharge, skin change or tenderness.  Abdominal:     General: Abdomen is flat. Bowel sounds are normal. There is no distension.     Palpations: Abdomen is soft. There is no mass.     Tenderness: There is no abdominal tenderness. There is no  guarding or rebound.     Hernia: No hernia is present.  Musculoskeletal: Normal range of motion.     Left shoulder: She exhibits tenderness (trapezius). She exhibits normal range of motion, no bony tenderness, no swelling, no effusion and no deformity.     Right lower leg: No edema.     Left lower leg: No edema.     Comments: Slightly ataxic gait. UE/LE 5/5 strength. TEnder over bilateral gluteus muscles, no spinal point tenderness, back with good ROM, bilateral hips with good ROM.   Lymphadenopathy:     Cervical: No cervical adenopathy.     Upper Body:     Right upper body: No supraclavicular, axillary or pectoral adenopathy.     Left upper body: No supraclavicular, axillary or pectoral adenopathy.  Skin:    General: Skin is warm and dry.  Neurological:     Mental Status: She is alert and  oriented to person, place, and time.     Deep Tendon Reflexes:     Reflex Scores:      Tricep reflexes are 2+ on the right side and 2+ on the left side.      Brachioradialis reflexes are 2+ on the right side and 2+ on the left side.      Patellar reflexes are 1+ on the right side and 1+ on the left side. Psychiatric:        Mood and Affect: Mood normal.        Behavior: Behavior normal.        Thought Content: Thought content normal.        Judgment: Judgment normal.       BP 130/78 (BP Location: Left Arm, Patient Position: Sitting, Cuff Size: Normal)   Pulse (!) 105   Temp (!) 97.2 F (36.2 C) (Temporal)   Ht 5' 0.25" (1.53 m)   Wt 191 lb 12.8 oz (87 kg)   SpO2 97%   BMI 37.15 kg/m  Wt Readings from Last 3 Encounters:  03/10/19 191 lb 12.8 oz (87 kg)  02/25/18 179 lb 8 oz (81.4 kg)  02/06/18 180 lb (81.6 kg)       Assessment & Plan:  1. Numbness and tingling of leg - Will try short term NSAID, exercises and she was instructed to notify me if no improvement in 1 week or if worsening. If no improvement, consider imaging.  - meloxicam (MOBIC) 7.5 MG tablet; Take 1 tablet (7.5 mg total) by mouth daily.  Dispense: 30 tablet; Refill: 0  2. Hip pain - per #1 - meloxicam (MOBIC) 7.5 MG tablet; Take 1 tablet (7.5 mg total) by mouth daily.  Dispense: 30 tablet; Refill: 0  3. Muscle spasm of left shoulder - meloxicam (MOBIC) 7.5 MG tablet; Take 1 tablet (7.5 mg total) by mouth daily.  Dispense: 30 tablet; Refill: 0 - tiZANidine (ZANAFLEX) 4 MG capsule; Take 1 capsule (4 mg total) by mouth at bedtime as needed for muscle spasms.  Dispense: 20 capsule; Refill: 0  4. Need for influenza vaccination - Flu Vaccine QUAD High Dose(Fluad)  5. Need for pneumococcal vaccine - Pneumococcal conjugate vaccine 13-valent  6. Essential hypertension - well controlled on current meds  7. Prediabetes - reviewed labs with patient - discussed diet and decreasing sugars/simple carbs, increase  protein, vegetables and fruits  8. Class 2 severe obesity due to excess calories with serious comorbidity and body mass index (BMI) of 37.0 to 37.9 in adult [facility]) - per #7  - follow up  in 6 months   Clarene Reamer, FNP-BC  [facility] at [facility]  03/11/2019 9:29 AM

## 2019-03-11 ENCOUNTER — Encounter: Payer: Self-pay | Admitting: Family Medicine

## 2019-03-11 DIAGNOSIS — Z6835 Body mass index (BMI) 35.0-35.9, adult: Secondary | ICD-10-CM | POA: Insufficient documentation

## 2019-03-11 DIAGNOSIS — E6609 Other obesity due to excess calories: Secondary | ICD-10-CM | POA: Insufficient documentation

## 2019-04-02 ENCOUNTER — Other Ambulatory Visit: Payer: Self-pay | Admitting: Family Medicine

## 2019-04-02 DIAGNOSIS — M25559 Pain in unspecified hip: Secondary | ICD-10-CM

## 2019-04-02 DIAGNOSIS — M62838 Other muscle spasm: Secondary | ICD-10-CM

## 2019-04-02 DIAGNOSIS — R200 Anesthesia of skin: Secondary | ICD-10-CM

## 2019-08-13 DIAGNOSIS — H43822 Vitreomacular adhesion, left eye: Secondary | ICD-10-CM | POA: Diagnosis not present

## 2019-09-02 ENCOUNTER — Other Ambulatory Visit: Payer: Self-pay | Admitting: Family Medicine

## 2019-09-02 DIAGNOSIS — I10 Essential (primary) hypertension: Secondary | ICD-10-CM

## 2019-09-04 ENCOUNTER — Other Ambulatory Visit: Payer: Self-pay | Admitting: Family Medicine

## 2019-09-04 ENCOUNTER — Other Ambulatory Visit: Payer: Self-pay | Admitting: Diagnostic Neuroimaging

## 2019-09-04 DIAGNOSIS — I10 Essential (primary) hypertension: Secondary | ICD-10-CM

## 2019-09-04 DIAGNOSIS — M62838 Other muscle spasm: Secondary | ICD-10-CM

## 2019-09-04 NOTE — Telephone Encounter (Signed)
Last OV 03/10/19 Upcoming OV 09/10/19  Please advise, thanks.

## 2019-09-10 ENCOUNTER — Ambulatory Visit: Payer: Medicare Other | Admitting: Family Medicine

## 2019-09-17 ENCOUNTER — Encounter: Payer: Self-pay | Admitting: Family Medicine

## 2019-09-17 ENCOUNTER — Ambulatory Visit (INDEPENDENT_AMBULATORY_CARE_PROVIDER_SITE_OTHER): Payer: Medicare Other | Admitting: Family Medicine

## 2019-09-17 ENCOUNTER — Ambulatory Visit (INDEPENDENT_AMBULATORY_CARE_PROVIDER_SITE_OTHER)
Admission: RE | Admit: 2019-09-17 | Discharge: 2019-09-17 | Disposition: A | Discharge status: Home / Self Care | Payer: Medicare Other | Source: Ambulatory Visit | Attending: Family Medicine | Admitting: Family Medicine

## 2019-09-17 ENCOUNTER — Other Ambulatory Visit: Payer: Self-pay

## 2019-09-17 VITALS — BP 138/64 | HR 75 | Temp 97.6°F | Ht 60.25 in | Wt 191.0 lb

## 2019-09-17 DIAGNOSIS — Z6837 Body mass index (BMI) 37.0-37.9, adult: Secondary | ICD-10-CM

## 2019-09-17 DIAGNOSIS — M25561 Pain in right knee: Secondary | ICD-10-CM

## 2019-09-17 DIAGNOSIS — R7303 Prediabetes: Secondary | ICD-10-CM

## 2019-09-17 DIAGNOSIS — I10 Essential (primary) hypertension: Secondary | ICD-10-CM | POA: Diagnosis not present

## 2019-09-17 DIAGNOSIS — G8929 Other chronic pain: Secondary | ICD-10-CM | POA: Diagnosis not present

## 2019-09-17 LAB — BASIC METABOLIC PANEL
BUN: 14 mg/dL (ref 6–23)
CO2: 34 mEq/L — ABNORMAL HIGH (ref 19–32)
Calcium: 10.3 mg/dL (ref 8.4–10.5)
Chloride: 98 mEq/L (ref 96–112)
Creatinine, Ser: 0.95 mg/dL (ref 0.40–1.20)
GFR: 70.83 mL/min (ref >60.00)
Glucose, Bld: 95 mg/dL (ref 70–99)
Potassium: 4.4 mEq/L (ref 3.5–5.1)
Sodium: 137 mEq/L (ref 135–145)

## 2019-09-17 LAB — HEMOGLOBIN A1C: Hgb A1c MFr Bld: 5.8 % (ref 4.6–6.5)

## 2019-09-17 NOTE — Patient Instructions (Addendum)
Please call and schedule an appointment for screening mammogram. A referral is not needed.  Joann Drake    A resource that I like is [email or web address] You tube videos- Dr. Sharman Cheek, The Fasting Method  Here are some guidelines to help you with meal planning -  Avoid all processed and packaged foods (bread, pasta, crackers, chips, etc) and beverages containing calories.  Avoid added sugars and excessive natural sugars.  Attention to how you feel if you consume artificial sweeteners.  Do they make you more hungry or raise your blood sugar?  With every meal and snack, aim to get 20 g of protein (3 ounces of meat, 4 ounces of fish, 3 eggs, protein powder, 1 cup [place] yogurt, 1 cup cottage cheese, etc.)  Increase fiber in the form of non-starchy vegetables.  These help you feel full with very little carbohydrates and are good for gut health.  Eat 1 serving healthy carb per meal- 1/2 cup brown rice, beans, potato, corn- pay attention to whether or not this significantly raises your blood sugar. If it does, reduce the frequency you consume these.   Eat 2-3 servings of lower sugar fruits daily.  This includes berries, apples, oranges, peaches, pears, one half banana.  Have small amounts of good fats such as avocado, nuts, olive oil, nut butters, olives.  Add a little cheese to your salads to make them tasty.

## 2019-09-17 NOTE — Progress Notes (Signed)
   Subjective:    Patient ID: Joann Drake, female    DOB: September 10, 1950, 69 y.o.   MRN: [id number]  HPI This is a 69 yo female who presents today for follow up of DM, weight, migraines. Continues to watch her 3, 41 yo grand children.   Headaches- worse with pollen, runny eyes. TAkes benadryl, claritin. Having a couple a week.   Obesity- has not been as good about water intake, eats more in house, eating "junk."   Numbness in right leg, comes and goes. Sometimes in calf, sometimes from hip down. Feels like knee will give way, scared to walk much.    Review of Systems Per HPI, no chest pain, SOB, leg swelling    Objective:   Physical Exam Vitals reviewed.  Constitutional:      General: She is not in acute distress.    Appearance: Normal appearance. She is obese. She is not ill-appearing, toxic-appearing or diaphoretic.  HENT:     Head: Normocephalic and atraumatic.  Eyes:     Conjunctiva/sclera: Conjunctivae normal.  Cardiovascular:     Rate and Rhythm: Normal rate and regular rhythm.     Heart sounds: Normal heart sounds.  Pulmonary:     Effort: Pulmonary effort is normal.     Breath sounds: Normal breath sounds.  Musculoskeletal:     Right lower leg: No edema.     Left lower leg: No edema.     Comments: Right knee with normal rom, no swelling.   Skin:    General: Skin is warm and dry.  Neurological:     Mental Status: She is alert and oriented to person, place, and time.  Psychiatric:        Mood and Affect: Mood normal.        Behavior: Behavior normal.        Thought Content: Thought content normal.        Judgment: Judgment normal.       BP 138/64 (BP Location: Left Arm, Patient Position: Sitting, Cuff Size: Normal)   Pulse 75   Temp 97.6 F (36.4 C) (Temporal)   Ht 5' 0.25" (1.53 m)   Wt 191 lb (86.6 kg)   SpO2 96%   BMI 36.99 kg/m  Wt Readings from Last 3 Encounters:  09/17/19 191 lb (86.6 kg)  03/10/19 191 lb 12.8 oz (87 kg)  02/25/18 179 lb  8 oz (81.4 kg)       Assessment & Plan:  1. Essential hypertension -  Well controlled - Basic Metabolic Panel  2. Prediabetes - encouraged her to decrease carbs, provided written resources - Hemoglobin A1c  3. Chronic pain of right knee - DG Knee Complete 4 Views Right; Future  4. Class 2 severe obesity due to excess calories with serious comorbidity and body mass index (BMI) of 37.0 to 37.9 in adult [facility]) - per #2  This visit occurred during the SARS-CoV-2 public health emergency.  Safety protocols were in place, including screening questions prior to the visit, additional usage of staff PPE, and extensive cleaning of exam room while observing appropriate contact time as indicated for disinfecting solutions.      Clarene Reamer, FNP-BC  [facility] at [facility], [facility]  09/17/2019 9:43 AM

## 2019-10-03 ENCOUNTER — Other Ambulatory Visit: Payer: Self-pay | Admitting: Diagnostic Neuroimaging

## 2019-11-26 ENCOUNTER — Other Ambulatory Visit: Payer: Self-pay | Admitting: Family Medicine

## 2019-11-26 DIAGNOSIS — I10 Essential (primary) hypertension: Secondary | ICD-10-CM

## 2020-02-29 ENCOUNTER — Other Ambulatory Visit: Payer: Self-pay | Admitting: Family Medicine

## 2020-02-29 DIAGNOSIS — I10 Essential (primary) hypertension: Secondary | ICD-10-CM

## 2020-03-01 ENCOUNTER — Other Ambulatory Visit: Payer: Self-pay | Admitting: Family Medicine

## 2020-03-04 ENCOUNTER — Other Ambulatory Visit: Payer: Self-pay | Admitting: Family Medicine

## 2020-03-04 DIAGNOSIS — E559 Vitamin D deficiency, unspecified: Secondary | ICD-10-CM

## 2020-03-04 DIAGNOSIS — R7303 Prediabetes: Secondary | ICD-10-CM

## 2020-03-04 DIAGNOSIS — Z6837 Body mass index (BMI) 37.0-37.9, adult: Secondary | ICD-10-CM

## 2020-03-04 DIAGNOSIS — I10 Essential (primary) hypertension: Secondary | ICD-10-CM

## 2020-03-05 ENCOUNTER — Other Ambulatory Visit: Payer: Self-pay

## 2020-03-05 ENCOUNTER — Other Ambulatory Visit (INDEPENDENT_AMBULATORY_CARE_PROVIDER_SITE_OTHER): Payer: Medicare Other

## 2020-03-05 ENCOUNTER — Ambulatory Visit (INDEPENDENT_AMBULATORY_CARE_PROVIDER_SITE_OTHER): Payer: Medicare Other

## 2020-03-05 DIAGNOSIS — E559 Vitamin D deficiency, unspecified: Secondary | ICD-10-CM | POA: Diagnosis not present

## 2020-03-05 DIAGNOSIS — R7303 Prediabetes: Secondary | ICD-10-CM

## 2020-03-05 DIAGNOSIS — Z6837 Body mass index (BMI) 37.0-37.9, adult: Secondary | ICD-10-CM

## 2020-03-05 DIAGNOSIS — I10 Essential (primary) hypertension: Secondary | ICD-10-CM

## 2020-03-05 DIAGNOSIS — Z0000 Encounter for general adult medical examination without abnormal findings: Secondary | ICD-10-CM

## 2020-03-05 LAB — COMPREHENSIVE METABOLIC PANEL
ALT: 13 U/L (ref 0–35)
AST: 18 U/L (ref 0–37)
Albumin: 4.2 g/dL (ref 3.5–5.2)
Alkaline Phosphatase: 75 U/L (ref 39–117)
BUN: 12 mg/dL (ref 6–23)
CO2: 33 mEq/L — ABNORMAL HIGH (ref 19–32)
Calcium: 10.1 mg/dL (ref 8.4–10.5)
Chloride: 101 mEq/L (ref 96–112)
Creatinine, Ser: 0.99 mg/dL (ref 0.40–1.20)
GFR: 58.45 mL/min — ABNORMAL LOW (ref >60.00)
Glucose, Bld: 96 mg/dL (ref 70–99)
Potassium: 4.4 mEq/L (ref 3.5–5.1)
Sodium: 140 mEq/L (ref 135–145)
Total Bilirubin: 0.4 mg/dL (ref 0.2–1.2)
Total Protein: 7.6 g/dL (ref 6.0–8.3)

## 2020-03-05 LAB — CBC WITH DIFFERENTIAL/PLATELET
Basophils Absolute: 0 10*3/uL (ref 0.0–0.1)
Basophils Relative: 0.5 % (ref 0.0–3.0)
Eosinophils Absolute: 0.2 10*3/uL (ref 0.0–0.7)
Eosinophils Relative: 4.4 % (ref 0.0–5.0)
HCT: 36.4 % (ref 36.0–46.0)
Hemoglobin: 12.3 g/dL (ref 12.0–15.0)
Lymphocytes Relative: 42.6 % (ref 12.0–46.0)
Lymphs Abs: 1.9 10*3/uL (ref 0.7–4.0)
MCHC: 33.8 g/dL (ref 30.0–36.0)
MCV: 90.3 fl (ref 78.0–100.0)
Monocytes Absolute: 0.4 10*3/uL (ref 0.1–1.0)
Monocytes Relative: 9.4 % (ref 3.0–12.0)
Neutro Abs: 1.9 10*3/uL (ref 1.4–7.7)
Neutrophils Relative %: 43.1 % (ref 43.0–77.0)
Platelets: 252 10*3/uL (ref 150.0–400.0)
RBC: 4.03 Mil/uL (ref 3.87–5.11)
RDW: 13.1 % (ref 11.5–15.5)
WBC: 4.4 10*3/uL (ref 4.0–10.5)

## 2020-03-05 LAB — LIPID PANEL
Cholesterol: 233 mg/dL — ABNORMAL HIGH (ref 0–200)
HDL: 76 mg/dL (ref >39.00)
LDL Cholesterol: 134 mg/dL — ABNORMAL HIGH (ref 0–99)
NonHDL: 156.73
Total CHOL/HDL Ratio: 3
Triglycerides: 113 mg/dL (ref 0.0–149.0)
VLDL: 22.6 mg/dL (ref 0.0–40.0)

## 2020-03-05 LAB — VITAMIN D 25 HYDROXY (VIT D DEFICIENCY, FRACTURES): VITD: 80.34 ng/mL (ref 30.00–100.00)

## 2020-03-05 NOTE — Progress Notes (Signed)
PCP notes:  Health Maintenance: Flu- due Tdap- insurance Colonoscopy- due Mammogram- due   Abnormal Screenings: none   Patient concerns: none   Nurse concerns: none   Next PCP appt.: 03/12/2020 @ 9:30 am

## 2020-03-05 NOTE — Progress Notes (Signed)
Subjective:   Joann Drake is a 69 y.o. female who presents for Medicare Annual (Subsequent) preventive examination.  Review of Systems: N/A      I connected with the patient today by telephone and verified that I am speaking with the correct person using two identifiers. Location patient: home Location nurse: work Persons participating in the telephone visit: patient, nurse.   I discussed the limitations, risks, security and privacy concerns of performing an evaluation and management service by telephone and the availability of in person appointments. I also discussed with the patient that there may be a patient responsible charge related to this service. The patient expressed understanding and verbally consented to this telephonic visit.        Cardiac Risk Factors include: advanced age (>36men, >34 women);hypertension     Objective:    Today's Vitals   03/05/20 1529  PainSc: 10-Worst pain ever   There is no height or weight on file to calculate BMI.  Advanced Directives 03/05/2020 02/28/2019 08/13/2017  Does Patient Have a Medical Advance Directive? No No No  Does patient want to make changes to medical advance directive? No - Patient declined - -  Would patient like information on creating a medical advance directive? - No - Patient declined -    Current Medications (verified) Outpatient Encounter Medications as of 03/05/2020  Medication Sig  . amLODipine (NORVASC) 5 MG tablet TAKE 1 TABLET BY MOUTH EVERY DAY  . aspirin EC 81 MG tablet Take 81 mg by mouth daily as needed (pain).  Marland Kitchen aspirin-acetaminophen-caffeine (EXCEDRIN MIGRAINE) 250-250-65 MG tablet Take 2 tablets by mouth every 8 (eight) hours as needed for headache or migraine.  . Aspirin-Salicylamide-Caffeine (BC HEADACHE POWDER PO) Take 1-2 packets by mouth 2 (two) times daily as needed (migraine).  . Cholecalciferol (VITAMIN D3) 1.25 MG (50000 UT) CAPS TAKE 1 CAPSULE BY MOUTH EVERY 7 (SEVEN) DAYS.  .  hydrochlorothiazide (HYDRODIURIL) 25 MG tablet TAKE 1 TABLET BY MOUTH EVERY DAY  . mineral oil liquid See admin instructions. Instill 4-5 drops in to left ear as needed for wax buildup  . Multiple Vitamin (MULTIVITAMIN WITH MINERALS) TABS tablet Take 1 tablet by mouth daily.  . peppermint oil liquid Apply topically as needed (headache/migraine).  . SUMAtriptan (IMITREX) 50 MG tablet Take 1 tablet (50 mg total) by mouth every 2 (two) hours as needed for migraine. May repeat in 2 hours x 1 if needed.  Marland Kitchen tiZANidine (ZANAFLEX) 4 MG capsule TAKE 1 CAPSULE (4 MG TOTAL) BY MOUTH AT BEDTIME AS NEEDED FOR MUSCLE SPASMS.  Marland Kitchen topiramate (TOPAMAX) 50 MG tablet Take 1 tablet (50 mg total) by mouth 2 (two) times daily.   No facility-administered encounter medications on file as of 03/05/2020.    Allergies (verified) Penicillins   History: Past Medical History:  Diagnosis Date  . Allergy   . Arthritis   . Hypertension   . Migraine    Past Surgical History:  Procedure Laterality Date  . TOOTH EXTRACTION    . TUBAL LIGATION     Family History  Problem Relation Age of Onset  . Asthma Mother   . Hypertension Mother   . Miscarriages / [place] Mother   . Stroke Mother   . Pneumonia Father   . Heart disease Sister   . Hypertension Sister   . Stroke Sister   . Kidney disease Daughter   . Early death Son   . Kidney disease Son    Social History   Socioeconomic  History  . Marital status: Married    Spouse name: Not on file  . Number of children: Not on file  . Years of education: Not on file  . Highest education level: Not on file  Occupational History  . Not on file  Tobacco Use  . Smoking status: Former Research scientist (life sciences)  . Smokeless tobacco: Never Used  Vaping Use  . Vaping Use: Never assessed  Substance and Sexual Activity  . Alcohol use: No  . Drug use: No  . Sexual activity: Not Currently  Other Topics Concern  . Not on file  Social History Narrative   Married   Education- college    Children - 3 living   Social Determinants of Health   Financial Resource Strain: Low Risk   . Difficulty of Paying Living Expenses: Not hard at all  Food Insecurity: No Food Insecurity  . Worried About Charity fundraiser in the Last Year: Never true  . Ran Out of Food in the Last Year: Never true  Transportation Needs: No Transportation Needs  . Lack of Transportation (Medical): No  . Lack of Transportation (Non-Medical): No  Physical Activity: Inactive  . Days of Exercise per Week: 0 days  . Minutes of Exercise per Session: 0 min  Stress: No Stress Concern Present  . Feeling of Stress : Not at all  Social Connections:   . Frequency of Communication with Friends and Family: Not on file  . Frequency of Social Gatherings with Friends and Family: Not on file  . Attends Religious Services: Not on file  . Active Member of Clubs or Organizations: Not on file  . Attends Archivist Meetings: Not on file  . Marital Status: Not on file    Tobacco Counseling Counseling given: Not Answered   Clinical Intake:  Pre-visit preparation completed: Yes  Pain : 0-10 Pain Score: 10-Worst pain ever Pain Type: Chronic pain Pain Location: Back Pain Descriptors / Indicators: Aching Pain Onset: More than a month ago Pain Frequency: Intermittent     Nutritional Risks: None Diabetes: No  How often do you need to have someone help you when you read instructions, pamphlets, or other written materials from your doctor or pharmacy?: 1 - Never What is the last grade level you completed in school?: college graduate  Diabetic: No Nutrition Risk Assessment:  Has the patient had any N/V/D within the last 2 months?  No  Does the patient have any non-healing wounds?  No  Has the patient had any unintentional weight loss or weight gain?  No   Diabetes:  Is the patient diabetic?  No  If diabetic, was a CBG obtained today?  N/A Did the patient bring in their glucometer from home?   N/A How often do you monitor your CBG's? N/A.   Financial Strains and Diabetes Management:  Are you having any financial strains with the device, your supplies or your medication? N/A.  Does the patient want to be seen by Chronic Care Management for management of their diabetes?  N/A Would the patient like to be referred to a Nutritionist or for Diabetic Management?  N/A    Interpreter Needed?: No  Information entered by :: CJohnson, LPN   Activities of Daily Living In your present state of health, do you have any difficulty performing the following activities: 03/05/2020  Hearing? N  Vision? N  Difficulty concentrating or making decisions? N  Walking or climbing stairs? N  Dressing or bathing? N  Doing errands, shopping?  N  Preparing Food and eating ? N  Using the Toilet? N  In the past six months, have you accidently leaked urine? N  Do you have problems with loss of bowel control? N  Managing your Medications? N  Managing your Finances? N  Housekeeping or managing your Housekeeping? N  Some recent data might be hidden    Patient Care Team: Elby Beck, FNP as PCP - General (Nurse Practitioner)  Indicate any recent Medical Services you may have received from other than [facility] providers in the past year (date may be approximate).     Assessment:   This is a routine wellness examination for Joann Drake.  Hearing/Vision screen  Hearing Screening   125Hz  250Hz  500Hz  1000Hz  2000Hz  3000Hz  4000Hz  6000Hz  8000Hz   Right ear:           Left ear:           Vision Screening Comments: Patient gets annual eye exams   Dietary issues and exercise activities discussed: Current Exercise Habits: The patient does not participate in regular exercise at present, Exercise limited by: None identified  Goals    . Patient Stated     02/28/2019, Patient states that she wants to lose some weight soon.     . Patient Stated     03/05/2020, I will maintain and continue medications as  prescribed      Depression Screen PHQ 2/9 Scores 03/05/2020 02/28/2019 02/25/2018 09/03/2017  PHQ - 2 Score 0 0 0 0  PHQ- 9 Score 0 0 - -    Fall Risk Fall Risk  03/05/2020 02/28/2019 02/25/2018  Falls in the past year? 1 1 No  Comment tripped and fell fell out of a swing and fell down some stairs. -  Number falls in past yr: 1 1 -  Injury with Fall? 0 0 -  Risk for fall due to : Medication side effect History of fall(s);Medication side effect;Impaired balance/gait -  Follow up Falls evaluation completed;Falls prevention discussed Falls evaluation completed;Falls prevention discussed -    Any stairs in or around the home? Yes  If so, are there any without handrails? No  Home free of loose throw rugs in walkways, pet beds, electrical cords, etc? Yes  Adequate lighting in your home to reduce risk of falls? Yes   ASSISTIVE DEVICES UTILIZED TO PREVENT FALLS:  Life alert? No  Use of a cane, walker or w/c? No  Grab bars in the bathroom? No  Shower chair or bench in shower? No  Elevated toilet seat or a handicapped toilet? No   TIMED UP AND GO:  Was the test performed? N/A, telephonic visit .    Cognitive Function: MMSE - Mini Mental State Exam 03/05/2020 02/28/2019  Orientation to time 5 5  Orientation to Place 5 5  Registration 3 3  Attention/ Calculation 5 5  Recall 3 3  Language- repeat 1 1  Mini Cog  Mini-Cog screen was completed. Maximum score is 22. A value of 0 denotes this part of the MMSE was not completed or the patient failed this part of the Mini-Cog screening.       Immunizations Immunization History  Administered Date(s) Administered  . Fluad Quad(high Dose 65+) 03/10/2019  . Influenza, High Dose Seasonal PF 02/25/2018  . PFIZER SARS-COV-2 Vaccination 01/01/2020, 01/22/2020  . Pneumococcal Conjugate-13 03/10/2019    TDAP status: Due, Education has been provided regarding the importance of this vaccine. Advised may receive this vaccine at local pharmacy or  Health  Dept. Aware to provide a copy of the vaccination record if obtained from local pharmacy or Health Dept. Verbalized acceptance and understanding. Flu Vaccine status: due, will get at physical Pneumococcal vaccine status: Up to date Covid-19 vaccine status: Completed vaccines  Qualifies for Shingles Vaccine? Yes   Zostavax completed No   Shingrix Completed?: No.    Education has been provided regarding the importance of this vaccine. Patient has been advised to call insurance company to determine out of pocket expense if they have not yet received this vaccine. Advised may also receive vaccine at local pharmacy or Health Dept. Verbalized acceptance and understanding.  Screening Tests Health Maintenance  Topic Date Due  . Hepatitis C Screening  Never done  . COLONOSCOPY  Never done  . INFLUENZA VACCINE  12/28/2019  . TETANUS/TDAP  03/05/2024 (Originally 12/12/1970)  . PNA vac Low Risk Adult (2 of 2 - PPSV23) 03/09/2020  . MAMMOGRAM  05/24/2020  . DEXA SCAN  Completed  . COVID-19 Vaccine  Completed    Health Maintenance  Health Maintenance Due  Topic Date Due  . Hepatitis C Screening  Never done  . COLONOSCOPY  Never done  . INFLUENZA VACCINE  12/28/2019    Colorectal cancer screening: due, will discuss with provider at physical Mammogram status: due, will discuss with provider at physical  Bone Density status: Completed 05/07/2018. Results reflect: Bone density results: NORMAL. Repeat every 2-5 years.  Lung Cancer Screening: (Low Dose CT Chest recommended if Age 15-80 years, 30 pack-year currently smoking OR have quit w/in 15years.) does not qualify.    Additional Screening:  Hepatitis C Screening: does qualify; Completed due  Vision Screening: Recommended annual ophthalmology exams for early detection of glaucoma and other disorders of the eye. Is the patient up to date with their annual eye exam?  Yes  Who is the provider or what is the name of the office in which the  patient attends annual eye exams? [facility], Dr. Brigitte Pulse If pt is not established with a provider, would they like to be referred to a provider to establish care? No .   Dental Screening: Recommended annual dental exams for proper oral hygiene  Community Resource Referral / Chronic Care Management: CRR required this visit?  No   CCM required this visit?  No      Plan:     I have personally reviewed and noted the following in the patient's chart:   . Medical and social history . Use of alcohol, tobacco or illicit drugs  . Current medications and supplements . Functional ability and status . Nutritional status . Physical activity . Advanced directives . List of other physicians . Hospitalizations, surgeries, and ER visits in previous 12 months . Vitals . Screenings to include cognitive, depression, and falls . Referrals and appointments  In addition, I have reviewed and discussed with patient certain preventive protocols, quality metrics, and best practice recommendations. A written personalized care plan for preventive services as well as general preventive health recommendations were provided to patient.   Due to this being a telephonic visit, the after visit summary with patients personalized plan was offered to patient via office or my-chart. Patient preferred to pick up at office at next visit or via mychart.   Andrez Grime, LPN   [id number]

## 2020-03-05 NOTE — Patient Instructions (Signed)
Joann Drake , Thank you for taking time to come for your Medicare Wellness Visit. I appreciate your ongoing commitment to your health goals. Please review the following plan we discussed and let me know if I can assist you in the future.   Screening recommendations/referrals: Colonoscopy: due, will discuss with provider Mammogram: due, will discuss with provider Bone Density: Up to date, completed 05/07/2018, due 2-5 years  Recommended yearly ophthalmology/optometry visit for glaucoma screening and checkup Recommended yearly dental visit for hygiene and checkup  Vaccinations: Influenza vaccine: due, will get at physical Pneumococcal vaccine: Up to date, completed 03/10/2019, due 03/09/2020 Tdap vaccine: decline (insurance) Shingles vaccine: due, check with your insurance regarding coverage if interested   Covid-19:Completed series  Advanced directives: Advance directive discussed with you today. Even though you declined this today please call our office should you change your mind and we can give you the proper paperwork for you to fill out.  Conditions/risks identified: hypertension  Next appointment: Follow up in one year for your annual wellness visit    Preventive Care 14 Years and Older, Female Preventive care refers to lifestyle choices and visits with your health care provider that can promote health and wellness. What does preventive care include?  A yearly physical exam. This is also called an annual well check.  Dental exams once or twice a year.  Routine eye exams. Ask your health care provider how often you should have your eyes checked.  Personal lifestyle choices, including:  Daily care of your teeth and gums.  Regular physical activity.  Eating a healthy diet.  Avoiding tobacco and drug use.  Limiting alcohol use.  Practicing safe sex.  Taking low-dose aspirin every day.  Taking vitamin and mineral supplements as recommended by your health care  provider. What happens during an annual well check? The services and screenings done by your health care provider during your annual well check will depend on your age, overall health, lifestyle risk factors, and family history of disease. Counseling  Your health care provider may ask you questions about your:  Alcohol use.  Tobacco use.  Drug use.  Emotional well-being.  Home and relationship well-being.  Sexual activity.  Eating habits.  History of falls.  Memory and ability to understand (cognition).  Work and work Statistician.  Reproductive health. Screening  You may have the following tests or measurements:  Height, weight, and BMI.  Blood pressure.  Lipid and cholesterol levels. These may be checked every 5 years, or more frequently if you are over 16 years old.  Skin check.  Lung cancer screening. You may have this screening every year starting at age 70 if you have a 30-pack-year history of smoking and currently smoke or have quit within the past 15 years.  Fecal occult blood test (FOBT) of the stool. You may have this test every year starting at age 75.  Flexible sigmoidoscopy or colonoscopy. You may have a sigmoidoscopy every 5 years or a colonoscopy every 10 years starting at age 76.  Hepatitis C blood test.  Hepatitis B blood test.  Sexually transmitted disease (STD) testing.  Diabetes screening. This is done by checking your blood sugar (glucose) after you have not eaten for a while (fasting). You may have this done every 1-3 years.  Bone density scan. This is done to screen for osteoporosis. You may have this done starting at age 54.  Mammogram. This may be done every 1-2 years. Talk to your health care provider about how often  you should have regular mammograms. Talk with your health care provider about your test results, treatment options, and if necessary, the need for more tests. Vaccines  Your health care provider may recommend certain  vaccines, such as:  Influenza vaccine. This is recommended every year.  Tetanus, diphtheria, and acellular pertussis (Tdap, Td) vaccine. You may need a Td booster every 10 years.  Zoster vaccine. You may need this after age 25.  Pneumococcal 13-valent conjugate (PCV13) vaccine. One dose is recommended after age 55.  Pneumococcal polysaccharide (PPSV23) vaccine. One dose is recommended after age 2. Talk to your health care provider about which screenings and vaccines you need and how often you need them. This information is not intended to replace advice given to you by your health care provider. Make sure you discuss any questions you have with your health care provider. Document Released: 06/11/2015 Document Revised: 02/02/2016 Document Reviewed: 03/16/2015 Elsevier Interactive Patient Education  2017 Joann Drake Prevention in the Home Falls can cause injuries. They can happen to people of all ages. There are many things you can do to make your home safe and to help prevent falls. What can I do on the outside of my home?  Regularly fix the edges of walkways and driveways and fix any cracks.  Remove anything that might make you trip as you walk through a door, such as a raised step or threshold.  Trim any bushes or trees on the path to your home.  Use bright outdoor lighting.  Clear any walking paths of anything that might make someone trip, such as rocks or tools.  Regularly check to see if handrails are loose or broken. Make sure that both sides of any steps have handrails.  Any raised decks and porches should have guardrails on the edges.  Have any leaves, snow, or ice cleared regularly.  Use sand or salt on walking paths during winter.  Clean up any spills in your garage right away. This includes oil or grease spills. What can I do in the bathroom?  Use night lights.  Install grab bars by the toilet and in the tub and shower. Do not use towel bars as grab  bars.  Use non-skid mats or decals in the tub or shower.  If you need to sit down in the shower, use a plastic, non-slip stool.  Keep the floor dry. Clean up any water that spills on the floor as soon as it happens.  Remove soap buildup in the tub or shower regularly.  Attach bath mats securely with double-sided non-slip rug tape.  Do not have throw rugs and other things on the floor that can make you trip. What can I do in the bedroom?  Use night lights.  Make sure that you have a light by your bed that is easy to reach.  Do not use any sheets or blankets that are too big for your bed. They should not hang down onto the floor.  Have a firm chair that has side arms. You can use this for support while you get dressed.  Do not have throw rugs and other things on the floor that can make you trip. What can I do in the kitchen?  Clean up any spills right away.  Avoid walking on wet floors.  Keep items that you use a lot in easy-to-reach places.  If you need to reach something above you, use a strong step stool that has a grab bar.  Keep electrical cords  out of the way.  Do not use floor polish or wax that makes floors slippery. If you must use wax, use non-skid floor wax.  Do not have throw rugs and other things on the floor that can make you trip. What can I do with my stairs?  Do not leave any items on the stairs.  Make sure that there are handrails on both sides of the stairs and use them. Fix handrails that are broken or loose. Make sure that handrails are as long as the stairways.  Check any carpeting to make sure that it is firmly attached to the stairs. Fix any carpet that is loose or worn.  Avoid having throw rugs at the top or bottom of the stairs. If you do have throw rugs, attach them to the floor with carpet tape.  Make sure that you have a light switch at the top of the stairs and the bottom of the stairs. If you do not have them, ask someone to add them for  you. What else can I do to help prevent falls?  Wear shoes that:  Do not have high heels.  Have rubber bottoms.  Are comfortable and fit you well.  Are closed at the toe. Do not wear sandals.  If you use a stepladder:  Make sure that it is fully opened. Do not climb a closed stepladder.  Make sure that both sides of the stepladder are locked into place.  Ask someone to hold it for you, if possible.  Clearly mark and make sure that you can see:  Any grab bars or handrails.  First and last steps.  Where the edge of each step is.  Use tools that help you move around (mobility aids) if they are needed. These include:  Canes.  Walkers.  Scooters.  Crutches.  Turn on the lights when you go into a dark area. Replace any light bulbs as soon as they burn out.  Set up your furniture so you have a clear path. Avoid moving your furniture around.  If any of your floors are uneven, fix them.  If there are any pets around you, be aware of where they are.  Review your medicines with your doctor. Some medicines can make you feel dizzy. This can increase your chance of falling. Ask your doctor what other things that you can do to help prevent falls. This information is not intended to replace advice given to you by your health care provider. Make sure you discuss any questions you have with your health care provider. Document Released: 03/11/2009 Document Revised: 10/21/2015 Document Reviewed: 06/19/2014 Elsevier Interactive Patient Education  2017 [employer].

## 2020-03-11 ENCOUNTER — Telehealth: Payer: Self-pay | Admitting: Internal Medicine

## 2020-03-11 NOTE — Telephone Encounter (Signed)
Voicemail full--appointment reminder for appt with Clarene Reamer FNP sent via [place].  LM

## 2020-03-12 ENCOUNTER — Ambulatory Visit (INDEPENDENT_AMBULATORY_CARE_PROVIDER_SITE_OTHER): Payer: Medicare Other | Admitting: Family Medicine

## 2020-03-12 ENCOUNTER — Encounter: Payer: Self-pay | Admitting: Family Medicine

## 2020-03-12 ENCOUNTER — Other Ambulatory Visit: Payer: Self-pay

## 2020-03-12 VITALS — BP 140/82 | HR 84 | Temp 97.8°F | Ht 60.0 in | Wt 183.5 lb

## 2020-03-12 DIAGNOSIS — I10 Essential (primary) hypertension: Secondary | ICD-10-CM | POA: Diagnosis not present

## 2020-03-12 DIAGNOSIS — Z1211 Encounter for screening for malignant neoplasm of colon: Secondary | ICD-10-CM

## 2020-03-12 DIAGNOSIS — R7303 Prediabetes: Secondary | ICD-10-CM

## 2020-03-12 DIAGNOSIS — Z23 Encounter for immunization: Secondary | ICD-10-CM

## 2020-03-12 DIAGNOSIS — E7841 Elevated Lipoprotein(a): Secondary | ICD-10-CM | POA: Diagnosis not present

## 2020-03-12 DIAGNOSIS — Z6835 Body mass index (BMI) 35.0-35.9, adult: Secondary | ICD-10-CM | POA: Diagnosis not present

## 2020-03-12 NOTE — Progress Notes (Signed)
Subjective:    Patient ID: Joann Drake, female    DOB: 11/26/1950, 69 y.o.   MRN: [id number]  HPI Chief Complaint  Patient presents with  . Follow-up   This is a 69 yo female who presents today for follow up of chronic medical conditions. Has been doing well.  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  Obesity- has been watching sugars.   Mammogram- has not had   Colon cancer screening- open to having colonoscopy  Insomnia-trouble staying asleep, tries to follow a regular schedule, some increased stress and anxiety over current events/pandemic.  Spends a lot of time looking on her phone.  Hyperlipidemia- The 10-year ASCVD risk score Mikey Bussing DC Jr., et al., 2013) is: 14.6%   Values used to calculate the score:     Age: 5 years     Sex: Female     Is Non-Hispanic African American: Yes     Diabetic: No     Tobacco smoker: No     Systolic Blood Pressure: 009 mmHg     Is BP treated: Yes     HDL Cholesterol: 76 mg/dL     Total Cholesterol: 233 mg/dL   Review of Systems Denies headache, visual change, chest pain, shortness of breath, abdominal pain, diarrhea/constipation, dysuria, hematuria, urinary frequency, positive for occasional stress incontinence    Objective:   Physical Exam Physical Exam  Constitutional: She is oriented to person, place, and time. She appears well-developed and well-nourished. No distress.  HENT:  Head: Normocephalic and atraumatic.  Right Ear: External ear normal. TM normal.  Left Ear: External ear normal. TM normal.  Nose: Nose normal.  Mouth/Throat: Oropharynx is clear and moist. No oropharyngeal exudate.  Eyes: Conjunctivae are normal.   Neck: Normal range of motion. Neck supple. No JVD present. No thyromegaly present.  Cardiovascular: Normal rate, regular rhythm, normal heart sounds and intact distal pulses.   Pulmonary/Chest: Effort normal and breath sounds normal. Right breast exhibits no inverted nipple, no  mass, no nipple discharge, no skin change and no tenderness. Left breast exhibits no inverted nipple, no mass, no nipple discharge, no skin change and no tenderness. Breasts are symmetrical.  Abdominal: Soft. Bowel sounds are normal. She exhibits no distension and no mass. There is no tenderness. There is no rebound and no guarding.   tenderness and no discharge. No vaginal discharge found.  Musculoskeletal: Normal range of motion. She exhibits no edema or tenderness.  Lymphadenopathy:    She has no cervical adenopathy.  Neurological: She is alert and oriented to person, place, and time.   Skin: Skin is warm and dry. She is not diaphoretic.  Psychiatric: She has a normal mood and affect. Her behavior is normal. Judgment and thought content normal.  Vitals reviewed.    BP 140/82   Pulse 84   Temp 97.8 F (36.6 C) (Temporal)   Ht 5' (1.524 m)   Wt 183 lb 8 oz (83.2 kg)   SpO2 98%   BMI 35.84 kg/m  Wt Readings from Last 3 Encounters:  03/12/20 183 lb 8 oz (83.2 kg)  09/17/19 191 lb (86.6 kg)  03/10/19 191 lb 12.8 oz (87 kg)   Depression screen [facility] 2/9 03/05/2020 02/28/2019 02/25/2018 09/03/2017  Decreased Interest 0 0 0 0  Down, Depressed, Hopeless 0 0 0 0  PHQ - 2 Score 0 0 0 0  Altered sleeping 0 0 - -  Tired, decreased energy 0 0 - -  Change in appetite 0  0 - -  Feeling bad or failure about yourself  0 0 - -  Trouble concentrating 0 0 - -  Moving slowly or fidgety/restless 0 0 - -  Suicidal thoughts 0 0 - -  PHQ-9 Score 0 0 - -  Difficult doing work/chores Not difficult at all Not difficult at all - -        Assessment & Plan:  1. Screening for colon cancer - Ambulatory referral to Gastroenterology  2. Need for influenza vaccination - Flu Vaccine QUAD High Dose(Fluad)  3. Need for 23-polyvalent pneumococcal polysaccharide vaccine - Pneumococcal polysaccharide vaccine 23-valent greater than or equal to 2yo subcutaneous/IM  4. Class 2 severe obesity due to excess  calories with serious comorbidity and body mass index (BMI) of 35.0 to 35.9 in adult [facility]) -Has made some progress with weight loss, encouraged her efforts and encouraged her to continue reducing sugars, simple starches, sweetened beverages.  Encouraged her to try to increase her walking daily  5. Essential hypertension -Adequate control on current medication  6. Prediabetes -Stable, encouraged increased activity and weight loss per #4  7. Elevated lipoprotein(a) -Discussed her increased risk of heart attack or stroke and reviewed options for starting medication.  Patient would like to work on her diet, exercise and weight loss. -We will recheck in 6 months  This visit occurred during the SARS-CoV-2 public health emergency.  Safety protocols were in place, including screening questions prior to the visit, additional usage of staff PPE, and extensive cleaning of exam room while observing appropriate contact time as indicated for disinfecting solutions.      Clarene Reamer, FNP-BC  [facility]  03/12/2020 6:05 PM

## 2020-03-12 NOTE — Patient Instructions (Addendum)
Please call and schedule an appointment for screening mammogram. [facility] will get a call about scheduling your colonoscopy  Please get your shingles and Tdap vaccines at your pharmacy in 1 months  Follow up in 6 months for cholesterol and blood sugar check.    Insomnia Insomnia is a sleep disorder that makes it difficult to fall asleep or stay asleep. Insomnia can cause fatigue, low energy, difficulty concentrating, mood swings, and poor performance at work or school. There are three different ways to classify insomnia:  Difficulty falling asleep.  Difficulty staying asleep.  Waking up too early in the morning. Any type of insomnia can be long-term (chronic) or short-term (acute). Both are common. Short-term insomnia usually lasts for three months or less. Chronic insomnia occurs at least three times a week for longer than three months. What are the causes? Insomnia may be caused by another condition, situation, or substance, such as:  Anxiety.  Certain medicines.  Gastroesophageal reflux disease (GERD) or other gastrointestinal conditions.  Asthma or other breathing conditions.  Restless legs syndrome, sleep apnea, or other sleep disorders.  Chronic pain.  Menopause.  Stroke.  Abuse of alcohol, tobacco, or illegal drugs.  Mental health conditions, such as depression.  Caffeine.  Neurological disorders, such as Alzheimer's disease.  An overactive thyroid (hyperthyroidism). Sometimes, the cause of insomnia may not be known. What increases the risk? Risk factors for insomnia include:  Gender. Women are affected more often than men.  Age. Insomnia is more common as you get older.  Stress.  Lack of exercise.  Irregular work schedule or working night shifts.  Traveling between different time zones.  Certain medical and mental health conditions. What are the signs or symptoms? If you have insomnia, the main symptom is having  trouble falling asleep or having trouble staying asleep. This may lead to other symptoms, such as:  Feeling fatigued or having low energy.  Feeling nervous about going to sleep.  Not feeling rested in the morning.  Having trouble concentrating.  Feeling irritable, anxious, or depressed. How is this diagnosed? This condition may be diagnosed based on:  Your symptoms and medical history. Your health care provider may ask about: ? Your sleep habits. ? Any medical conditions you have. ? Your mental health.  A physical exam. How is this treated? Treatment for insomnia depends on the cause. Treatment may focus on treating an underlying condition that is causing insomnia. Treatment may also include:  Medicines to help you sleep.  Counseling or therapy.  Lifestyle adjustments to help you sleep better. Follow these instructions at home: Eating and drinking   Limit or avoid alcohol, caffeinated beverages, and cigarettes, especially close to bedtime. These can disrupt your sleep.  Do not eat a large meal or eat spicy foods right before bedtime. This can lead to digestive discomfort that can make it hard for you to sleep. Sleep habits   Keep a sleep diary to help you and your health care provider figure out what could be causing your insomnia. Write down: ? When you sleep. ? When you wake up during the night. ? How well you sleep. ? How rested you feel the next day. ? Any side effects of medicines you are taking. ? What you eat and drink.  Make your bedroom a dark, comfortable place where it is easy to fall asleep. ? Put up shades or blackout curtains to block light from outside. ? Use a white noise machine to block  noise. ? Keep the temperature cool.  Limit screen use before bedtime. This includes: ? Watching TV. ? Using your smartphone, tablet, or computer.  Stick to a routine that includes going to bed and waking up at the same times every day and night. This can help  you fall asleep faster. Consider making a quiet activity, such as reading, part of your nighttime routine.  Try to avoid taking naps during the day so that you sleep better at night.  Get out of bed if you are still awake after 15 minutes of trying to sleep. Keep the lights down, but try reading or doing a quiet activity. When you feel sleepy, go back to bed. General instructions  Take over-the-counter and prescription medicines only as told by your health care provider.  Exercise regularly, as told by your health care provider. Avoid exercise starting several hours before bedtime.  Use relaxation techniques to manage stress. Ask your health care provider to suggest some techniques that may work well for you. These may include: ? Breathing exercises. ? Routines to release muscle tension. ? Visualizing peaceful scenes.  Make sure that you drive carefully. Avoid driving if you feel very sleepy.  Keep all follow-up visits as told by your health care provider. This is important. Contact a health care provider if:  You are tired throughout the day.  You have trouble in your daily routine due to sleepiness.  You continue to have sleep problems, or your sleep problems get worse. Get help right away if:  You have serious thoughts about hurting yourself or someone else. If you ever feel like you may hurt yourself or others, or have thoughts about taking your own life, get help right away. You can go to your nearest emergency department or call:  Your local emergency services (911 in the [place]  A suicide crisis helpline, such as the [place] at 203-758-0633. This is open 24 hours a day. Summary  Insomnia is a sleep disorder that makes it difficult to fall asleep or stay asleep.  Insomnia can be long-term (chronic) or short-term (acute).  Treatment for insomnia depends on the cause. Treatment may focus on treating an underlying condition that is causing  insomnia.  Keep a sleep diary to help you and your health care provider figure out what could be causing your insomnia. This information is not intended to replace advice given to you by your health care provider. Make sure you discuss any questions you have with your health care provider. Document Revised: 04/27/2017 Document Reviewed: 02/22/2017 Elsevier Patient Education  2020 [employer].

## 2020-04-18 IMAGING — CR DG CHEST 2V
2 series · 2 of 2 positions shown · non-contrast
Comparison: None.

CLINICAL DATA: Chest pain for 2 days

EXAM:
CHEST - 2 VIEW

[chest pa]
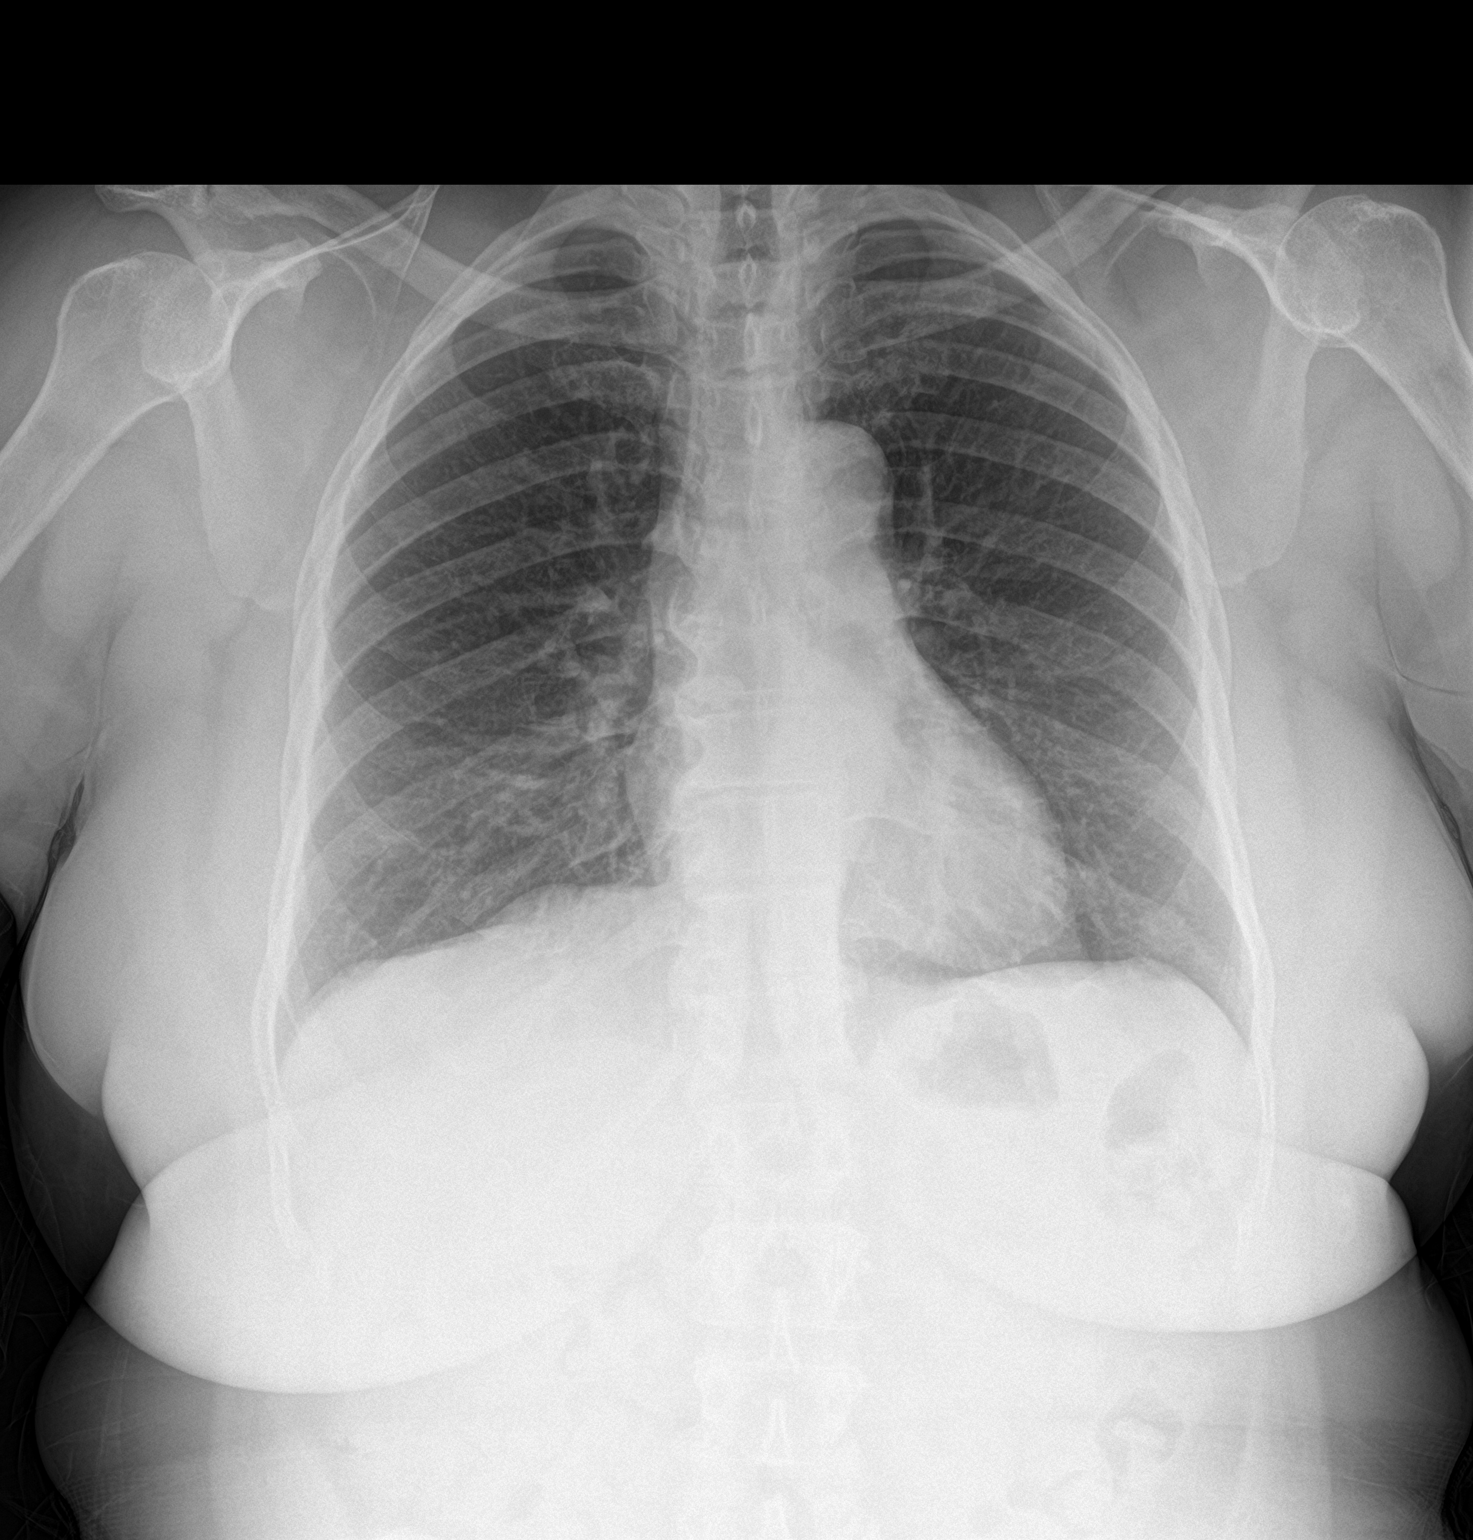

[chest lat]
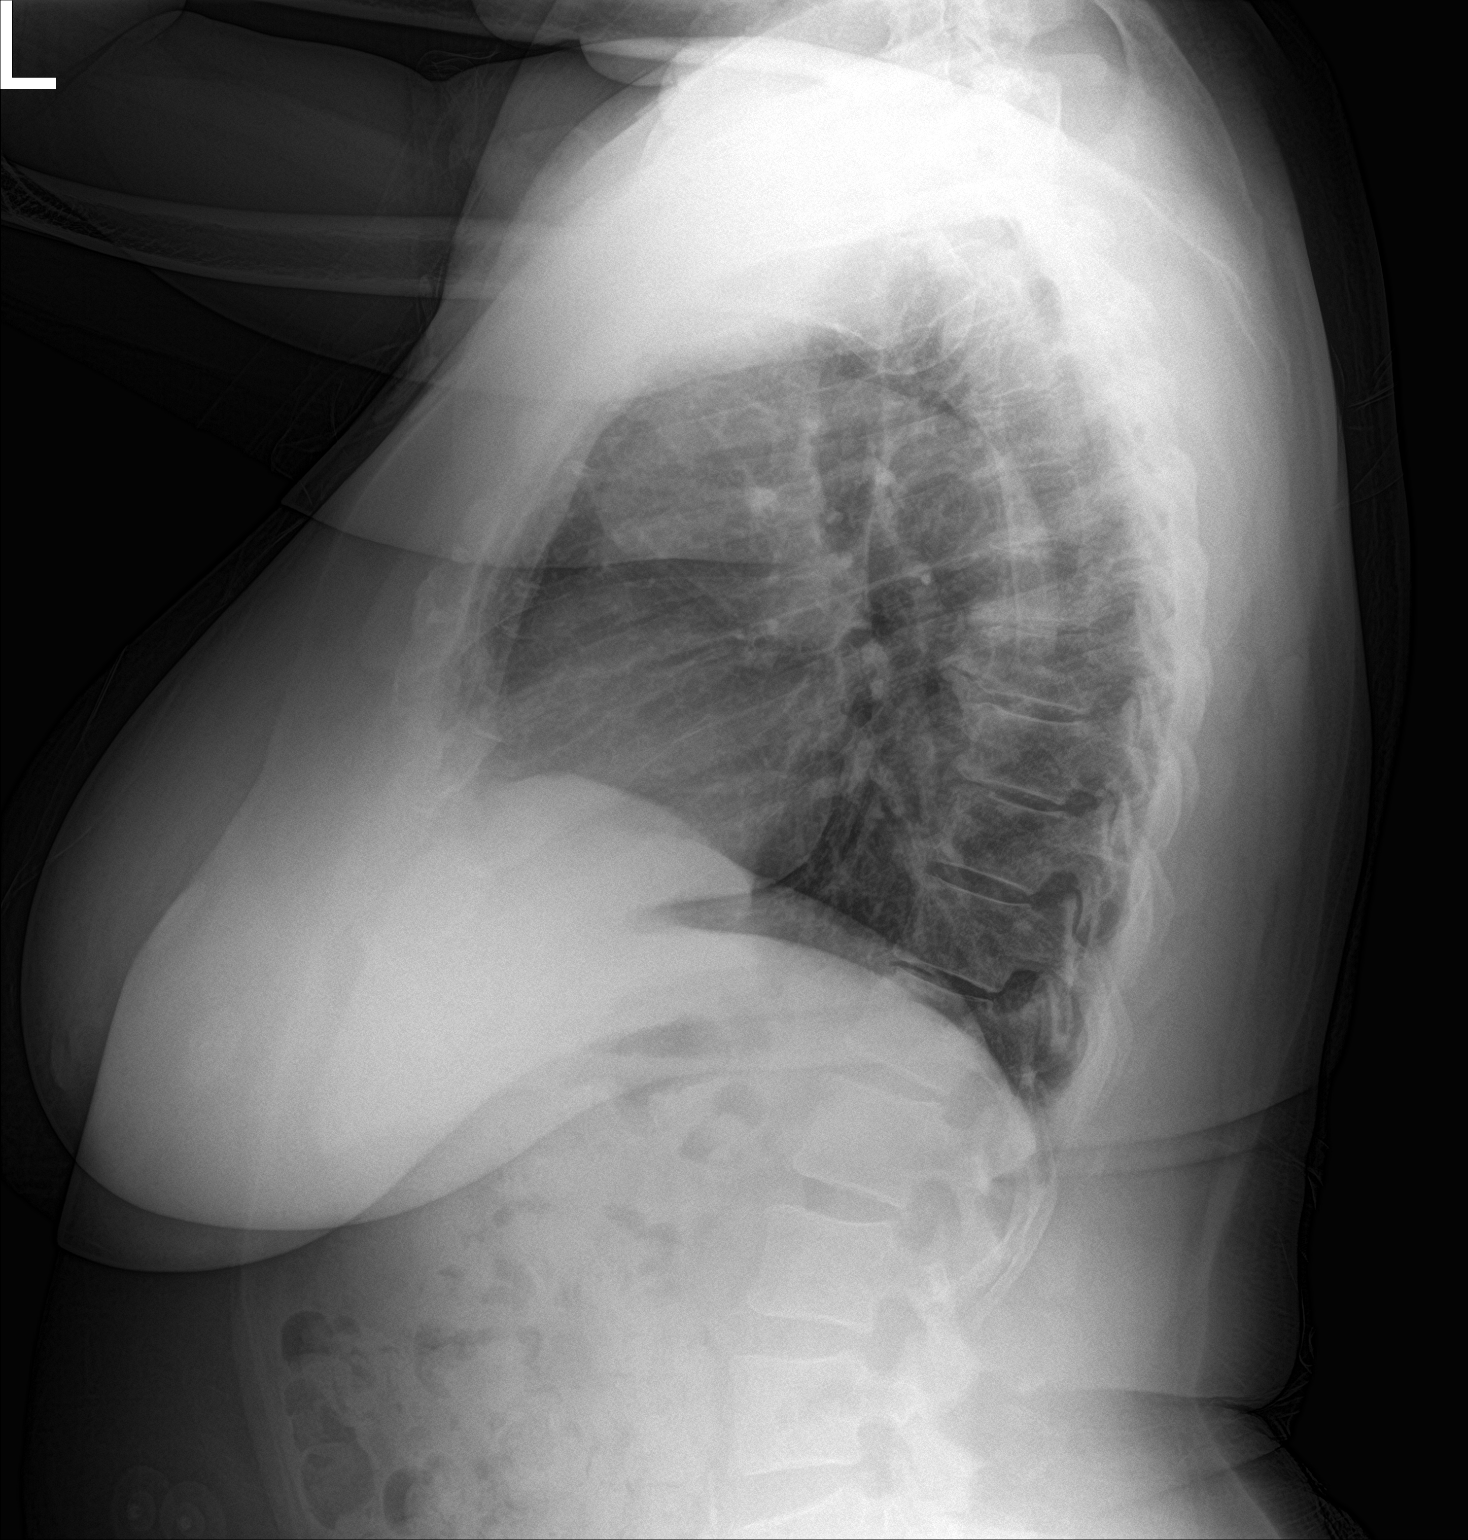

[2 of 2 positions shown; findings below may reference images not displayed]

FINDINGS: The heart size and mediastinal contours are within normal limits.
Both lungs are clear. The visualized skeletal structures are
unremarkable.
IMPRESSION: No active cardiopulmonary disease.

## 2020-05-20 ENCOUNTER — Other Ambulatory Visit: Payer: Self-pay | Admitting: Family Medicine

## 2020-05-20 NOTE — Telephone Encounter (Signed)
Attempted to call patient, but her voicemail box is not set up.

## 2020-05-20 NOTE — Telephone Encounter (Signed)
Last office visit 03/12/2020 for follow up.  Last refilled 03/02/2020 for #12 with no refills.  Last Vit D level normal at 80.34 ng/ml on 03/05/2020.  Refill?

## 2020-05-20 NOTE — Telephone Encounter (Signed)
Please call patient and let her know that she no longer needs the prescription strength vitamin D. Her last level was normal. She can take an over there counter vitamin D3, 1,000 IU daily.

## 2020-06-02 ENCOUNTER — Other Ambulatory Visit: Payer: Self-pay

## 2020-06-02 DIAGNOSIS — I10 Essential (primary) hypertension: Secondary | ICD-10-CM

## 2020-06-02 MED ORDER — HYDROCHLOROTHIAZIDE 25 MG PO TABS: 25.0000 mg | ORAL_TABLET | Freq: Every day | ORAL | 1 refills | Status: DC

## 2020-06-02 NOTE — Progress Notes (Signed)
Pharmacy requests refill on: Hydrochlorothiazide 25 mg    LAST REFILL: 03/02/2020 (Q-90, R-0) LAST OV: 03/12/2020 NEXT OV: Not Scheduled  PHARMACY:[id number] [place], [place]

## 2020-06-18 ENCOUNTER — Ambulatory Visit (AMBULATORY_SURGERY_CENTER): Payer: Medicare PPO

## 2020-06-18 ENCOUNTER — Other Ambulatory Visit: Payer: Self-pay

## 2020-06-18 VITALS — Ht 62.0 in | Wt 184.0 lb

## 2020-06-18 DIAGNOSIS — Z1211 Encounter for screening for malignant neoplasm of colon: Secondary | ICD-10-CM

## 2020-06-18 MED ORDER — PLENVU 140 G PO SOLR
1.0000 | ORAL | 0 refills | Status: DC
Start: 2020-06-18 — End: 2020-06-30

## 2020-06-18 NOTE — Progress Notes (Signed)
Pre visit completed via phone call; patient verified name, DOB, and address;  No egg or soy allergy known to patient  No issues with past sedation with any surgeries or procedures No intubation problems in the past  No FH of Malignant Hyperthermia No diet pills per patient No home 02 use per patient  No blood thinners per patient  Pt denies issues with constipation  No A fib or A flutter  EMMI video via [place] 19 guidelines implemented in [place] today with Pt and RN  Pt is fully vaccinated  for Covid x 2; Coupon given to pt in [place] today , Code to Pharmacy  Due to the COVID-19 pandemic we are asking patients to follow certain guidelines.  Pt aware of COVID protocols and [facility] guidelines

## 2020-06-30 ENCOUNTER — Telehealth: Payer: Self-pay | Admitting: Gastroenterology

## 2020-06-30 DIAGNOSIS — Z1211 Encounter for screening for malignant neoplasm of colon: Secondary | ICD-10-CM

## 2020-06-30 MED ORDER — PLENVU 140 G PO SOLR: 1.0000 | Freq: Once | ORAL | 0 refills | Status: AC

## 2020-06-30 NOTE — Telephone Encounter (Signed)
Confirmed pt's pharmacy.   Then I Called [place] medicare coupon to the pt's pharmacy.

## 2020-07-02 ENCOUNTER — Encounter: Payer: Self-pay | Admitting: Gastroenterology

## 2020-07-02 ENCOUNTER — Ambulatory Visit (AMBULATORY_SURGERY_CENTER): Payer: Medicare PPO | Admitting: Gastroenterology

## 2020-07-02 ENCOUNTER — Other Ambulatory Visit: Payer: Self-pay

## 2020-07-02 VITALS — BP 129/76 | HR 83 | Temp 97.2°F | Resp 15 | Ht 62.0 in | Wt 184.0 lb

## 2020-07-02 DIAGNOSIS — Z1211 Encounter for screening for malignant neoplasm of colon: Secondary | ICD-10-CM

## 2020-07-02 DIAGNOSIS — K5730 Diverticulosis of large intestine without perforation or abscess without bleeding: Secondary | ICD-10-CM

## 2020-07-02 DIAGNOSIS — K635 Polyp of colon: Secondary | ICD-10-CM | POA: Diagnosis not present

## 2020-07-02 DIAGNOSIS — K641 Second degree hemorrhoids: Secondary | ICD-10-CM

## 2020-07-02 DIAGNOSIS — D125 Benign neoplasm of sigmoid colon: Secondary | ICD-10-CM

## 2020-07-02 MED ORDER — SODIUM CHLORIDE 0.9 % IV SOLN: 500.0000 mL | INTRAVENOUS | Status: DC

## 2020-07-02 NOTE — Progress Notes (Signed)
Called to room to assist during endoscopic procedure.  Patient ID and intended procedure confirmed with present staff. Received instructions for my participation in the procedure from the performing physician.  

## 2020-07-02 NOTE — Op Note (Signed)
[facility] Patient Name: Joann Drake Procedure Date: 07/02/2020 11:34 AM MRN: [id number] Endoscopist: Doristine Locks , MD Age: 70 Referring MD:  Date of Birth: Aug 21, 1950 Gender: Female Account #: [id number] Procedure:                Colonoscopy Indications:              Screening for colorectal malignant neoplasm, This                            is the patient's first colonoscopy Medicines:                Monitored Anesthesia Care Procedure:                Pre-Anesthesia Assessment:                           - Prior to the procedure, a History and Physical                            was performed, and patient medications and                            allergies were reviewed. The patient's tolerance of                            previous anesthesia was also reviewed. The risks                            and benefits of the procedure and the sedation                            options and risks were discussed with the patient.                            All questions were answered, and informed consent                            was obtained. Prior Anticoagulants: The patient has                            taken no previous anticoagulant or antiplatelet                            agents. ASA Grade Assessment: II - A patient with                            mild systemic disease. After reviewing the risks                            and benefits, the patient was deemed in                            satisfactory condition to undergo the procedure.  After obtaining informed consent, the colonoscope                            was passed under direct vision. Throughout the                            procedure, the patient's blood pressure, pulse, and                            oxygen saturations were monitored continuously. The                            Colonoscope was introduced through the anus and                            advanced to the the  terminal ileum. The colonoscopy                            was performed without difficulty. The patient                            tolerated the procedure well. The quality of the                            bowel preparation was good. The terminal ileum,                            ileocecal valve, appendiceal orifice, and rectum                            were photographed. Scope In: 11:37:28 AM Scope Out: 11:55:21 AM Scope Withdrawal Time: 0 hours 12 minutes 19 seconds  Total Procedure Duration: 0 hours 17 minutes 53 seconds  Findings:                 Hemorrhoids were found on perianal exam.                           A 4 mm polyp was found in the sigmoid colon. The                            polyp was sessile. The polyp was removed with a                            cold snare. Resection and retrieval were complete.                            Estimated blood loss was minimal.                           A few small-mouthed diverticula were found in the                            ascending colon.  Retroflexion in the rectum was not performed due to                            anatomy (narrowed rectal vault).                           Non-bleeding internal hemorrhoids were found. The                            hemorrhoids were small and Grade II (internal                            hemorrhoids that prolapse but reduce                            spontaneously). The rectum otherwise was normal on                            anterograde views.                           The terminal ileum appeared normal. Complications:            No immediate complications. Estimated Blood Loss:     Estimated blood loss was minimal. Impression:               - Hemorrhoids found on perianal exam.                           - One 4 mm polyp in the sigmoid colon, removed with                            a cold snare. Resected and retrieved.                           - Diverticulosis in the  ascending colon.                           - Non-bleeding internal hemorrhoids.                           - The examined portion of the ileum was normal. Recommendation:           - Patient has a contact number available for                            emergencies. The signs and symptoms of potential                            delayed complications were discussed with the                            patient. Return to normal activities tomorrow.                            Written discharge instructions were provided to  the                            patient.                           - Resume previous diet.                           - Continue present medications.                           - Await pathology results.                           - Repeat colonoscopy for surveillance based on                            pathology results.                           - Return to GI office PRN.                           - Use fiber, for example Citrucel, Fibercon, Konsyl                            or Metamucil.                           - Internal hemorrhoids were noted on this study and                            may be amenable to hemorrhoid band ligation. If you                            are interested in further treatment of these                            hemorrhoids with band ligation, please contact my                            clinic to set up an appointment for evaluation and                            treatment. Gerrit Heck, MD 07/02/2020 12:00:55 PM

## 2020-07-02 NOTE — Progress Notes (Signed)
PT taken to PACU. Monitors in place. VSS. Report given to RN. 

## 2020-07-02 NOTE — Patient Instructions (Signed)
Resume previous medications.  Await pathology for final recommendations.  Handouts on findings given to patient.   1 polyp removed and sent to pathology.  Diverticulosis and internal hemorrhoids.   YOU HAD AN ENDOSCOPIC PROCEDURE TODAY AT [facility]:   Refer to the procedure report that was given to you for any specific questions about what was found during the examination.  If the procedure report does not answer your questions, please call your gastroenterologist to clarify.  If you requested that your care partner not be given the details of your procedure findings, then the procedure report has been included in a sealed envelope for you to review at your convenience later.  YOU SHOULD EXPECT: Some feelings of bloating in the abdomen. Passage of more gas than usual.  Walking can help get rid of the air that was put into your GI tract during the procedure and reduce the bloating. If you had a lower endoscopy (such as a colonoscopy or flexible sigmoidoscopy) you may notice spotting of blood in your stool or on the toilet paper. If you underwent a bowel prep for your procedure, you may not have a normal bowel movement for a few days.  Please Note:  You might notice some irritation and congestion in your nose or some drainage.  This is from the oxygen used during your procedure.  There is no need for concern and it should clear up in a day or so.  SYMPTOMS TO REPORT IMMEDIATELY:   Following lower endoscopy (colonoscopy or flexible sigmoidoscopy):  Excessive amounts of blood in the stool  Significant tenderness or worsening of abdominal pains  Swelling of the abdomen that is new, acute  Fever of 100F or higher   For urgent or emergent issues, a gastroenterologist can be reached at any hour by calling [phone]. Do not use MyChart messaging for urgent concerns.    DIET:  We do recommend a small meal at first, but then you may proceed to your regular diet.  Drink plenty of  fluids but you should avoid alcoholic beverages for 24 hours.  ACTIVITY:  You should plan to take it easy for the rest of today and you should NOT DRIVE or use heavy machinery until tomorrow (because of the sedation medicines used during the test).    FOLLOW UP: Our staff will call the number listed on your records 48-72 hours following your procedure to check on you and address any questions or concerns that you may have regarding the information given to you following your procedure. If we do not reach you, we will leave a message.  We will attempt to reach you two times.  During this call, we will ask if you have developed any symptoms of COVID 19. If you develop any symptoms (ie: fever, flu-like symptoms, shortness of breath, cough etc.) before then, please call [phone].  If you test positive for Covid 19 in the 2 weeks post procedure, please call and report this information to [place].    If any biopsies were taken you will be contacted by phone or by letter within the next 1-3 weeks.  Please call us at [phone] if you have not heard about the biopsies in 3 weeks.    SIGNATURES/CONFIDENTIALITY: You and/or your care partner have signed paperwork which will be entered into your electronic medical record.  These signatures attest to the fact that that the information above on your After Visit Summary has been reviewed and is understood.  Full  responsibility of the confidentiality of this discharge information lies with you and/or your care-partner.

## 2020-07-06 ENCOUNTER — Telehealth: Payer: Self-pay

## 2020-07-06 NOTE — Telephone Encounter (Signed)
  Follow up Call-  Call back number 07/02/2020  Post procedure Call Back phone  # [phone]  Permission to leave phone message Yes  Some recent data might be hidden     Patient questions:  Do you have a fever, pain , or abdominal swelling? No. Pain Score  0 *  Have you tolerated food without any problems? Yes.    Have you been able to return to your normal activities? Yes.    Do you have any questions about your discharge instructions: Diet   No. Medications  No. Follow up visit  No.  Do you have questions or concerns about your Care? No.  Actions: * If pain score is 4 or above: 1. No action needed, pain <4.Have you developed a fever since your procedure? no  2.   Have you had an respiratory symptoms (SOB or cough) since your procedure? no  3.   Have you tested positive for COVID 19 since your procedure no  4.   Have you had any family members/close contacts diagnosed with the COVID 19 since your procedure?  no   If yes to any of these questions please route to Joylene John, RN and Joella Prince, RN

## 2020-07-15 ENCOUNTER — Encounter: Payer: Self-pay | Admitting: Gastroenterology

## 2020-07-27 ENCOUNTER — Other Ambulatory Visit: Payer: Self-pay

## 2020-07-27 ENCOUNTER — Encounter: Payer: Medicare PPO | Admitting: Family Medicine

## 2020-08-09 ENCOUNTER — Other Ambulatory Visit: Payer: Self-pay

## 2020-08-09 ENCOUNTER — Ambulatory Visit (INDEPENDENT_AMBULATORY_CARE_PROVIDER_SITE_OTHER): Payer: Medicare PPO | Admitting: Family Medicine

## 2020-08-09 ENCOUNTER — Encounter: Payer: Self-pay | Admitting: Family Medicine

## 2020-08-09 VITALS — BP 130/84 | HR 93 | Temp 96.9°F | Ht 62.0 in | Wt 186.4 lb

## 2020-08-09 DIAGNOSIS — E785 Hyperlipidemia, unspecified: Secondary | ICD-10-CM | POA: Insufficient documentation

## 2020-08-09 DIAGNOSIS — R7303 Prediabetes: Secondary | ICD-10-CM | POA: Diagnosis not present

## 2020-08-09 DIAGNOSIS — I10 Essential (primary) hypertension: Secondary | ICD-10-CM | POA: Diagnosis not present

## 2020-08-09 DIAGNOSIS — E6609 Other obesity due to excess calories: Secondary | ICD-10-CM

## 2020-08-09 DIAGNOSIS — G43909 Migraine, unspecified, not intractable, without status migrainosus: Secondary | ICD-10-CM

## 2020-08-09 DIAGNOSIS — R202 Paresthesia of skin: Secondary | ICD-10-CM | POA: Insufficient documentation

## 2020-08-09 DIAGNOSIS — Z6834 Body mass index (BMI) 34.0-34.9, adult: Secondary | ICD-10-CM

## 2020-08-09 DIAGNOSIS — E559 Vitamin D deficiency, unspecified: Secondary | ICD-10-CM | POA: Diagnosis not present

## 2020-08-09 DIAGNOSIS — E7800 Pure hypercholesterolemia, unspecified: Secondary | ICD-10-CM

## 2020-08-09 MED ORDER — VITAMIN D3 1.25 MG (50000 UT) PO CAPS: ORAL_CAPSULE | ORAL | 2 refills | Status: AC

## 2020-08-09 NOTE — Assessment & Plan Note (Signed)
Plan to continue topamax 50 mg bid and prn imitrex and excedrin migraine  Stable  Has seen neurology in the past

## 2020-08-09 NOTE — Assessment & Plan Note (Signed)
Lab Results  Component Value Date   HGBA1C 5.8 09/17/2019   disc imp of low glycemic diet and wt loss to prevent DM2
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
## 2020-08-09 NOTE — Assessment & Plan Note (Addendum)
Intermittent with weakness Causing her to almost fall  No new back symptoms /isolated to that leg  No stroke symptoms  Nl exam today  Ref to neurology for further exam  Disc poss causes such as lumbar radiculopathy or MS or neuropathy

## 2020-08-09 NOTE — Patient Instructions (Addendum)
To prevent diabetes   Try to get most of your carbohydrates from produce (with the exception of white potatoes)  Eat less bread/pasta/rice/snack foods/cereals/sweets and other items from the middle of the grocery store (processed carbs)  For cholesterol Avoid red meat/ fried foods/ egg yolks/ fatty breakfast meats/ butter, cheese and high fat dairy/ and shellfish    You will get a call about a neurology referral I placed for your leg If something changes in the meantime   Follow up for an annual visit in october

## 2020-08-09 NOTE — Assessment & Plan Note (Signed)
Discussed how this problem influences overall health and the risks it imposes  Reviewed plan for weight loss with lower calorie diet (via better food choices and also portion control or program like weight watchers) and exercise building up to or more than 30 minutes 5 days per week including some aerobic activity    

## 2020-08-09 NOTE — Assessment & Plan Note (Signed)
bp in fair control at this time  BP Readings from Last 1 Encounters:  08/09/20 130/84   No changes needed Most recent labs reviewed  Disc lifstyle change with low sodium diet and exercise  Plan to continue hctz 25 mg daily and amlodipine 5 mg daily

## 2020-08-09 NOTE — Assessment & Plan Note (Signed)
Vitamin D level is therapeutic with current supplementation Disc importance of this to bone and overall health Will continue ergocalciferol 50,000 iu weekly

## 2020-08-09 NOTE — Assessment & Plan Note (Signed)
Disc goals for lipids and reasons to control them Rev last labs with pt Rev low sat fat diet in detail Diet is fairly good  Will re check in the fall  Consider statin if not improving (has fam hx of CAD)

## 2020-09-01 ENCOUNTER — Ambulatory Visit: Payer: Medicare Other | Admitting: Family Medicine

## 2020-09-14 ENCOUNTER — Telehealth: Payer: Self-pay | Admitting: Diagnostic Neuroimaging

## 2020-09-14 ENCOUNTER — Ambulatory Visit: Payer: Medicare PPO | Admitting: Diagnostic Neuroimaging

## 2020-09-14 ENCOUNTER — Encounter: Payer: Self-pay | Admitting: Diagnostic Neuroimaging

## 2020-09-14 VITALS — BP 151/91 | HR 82 | Ht 62.0 in | Wt 188.0 lb

## 2020-09-14 DIAGNOSIS — M5442 Lumbago with sciatica, left side: Secondary | ICD-10-CM

## 2020-09-14 DIAGNOSIS — M5441 Lumbago with sciatica, right side: Secondary | ICD-10-CM

## 2020-09-14 DIAGNOSIS — G8929 Other chronic pain: Secondary | ICD-10-CM | POA: Diagnosis not present

## 2020-09-14 NOTE — Progress Notes (Signed)
Average number of migraines depends on weather, what I eat; maybe 1-2 x a month or all month. Front and sides of head, no nausea, light/sound sensitivity. Sumatriptan helps, take BC or Excedrin if its a light headache.  Tried/failed Tylenol.

## 2020-09-14 NOTE — Progress Notes (Signed)
[facility]  PATIENT: Joann Drake DOB: 1951/04/17  REFERRING CLINICIAN: Tower, Wynelle Fanny, MD  HISTORY FROM: patient  REASON FOR VISIT: new consult    HISTORICAL  CHIEF COMPLAINT:  Chief Complaint  Patient presents with  . Migraine    Rm 7 Established patient "Average number of migraines depends on weather, what I eat; maybe 1-2 x a month or all month"    HISTORY OF PRESENT ILLNESS:   UPDATE (09/14/20, VRP): Since last visit, doing better with headaches. Now referred back for right leg numbness / weakness since Dec 2021. Sxs intermittent. Sometimes sxs radiate to groin / genital region. Balance can be off also. Also bilateral shoulder pain.   PRIOR HPI 11/07/17: 70 year old female here for evaluation of headaches.  Patient has had headaches since teenage years.  She describes global, unilateral, migratory severe throbbing headaches with nausea, photophobia and phonophobia.  Headaches would last hours, days or even weeks at a time.  She tried over-the-counter medications without relief.  She has tried tramadol and another prescription medication without relief.  Sometimes she feels visual aura symptoms before onset of headache.  Triggering factors could include caffeine and certain types of food such as raisins or other sweets.  Patient is averaging at least 20 days of headache per month.  Headaches have not significantly changed in characteristics over time.  She is having more frequent headaches, but gradually worsening over many years.  No other associated neurologic symptoms such as numbness, weakness, slurred speech.   REVIEW OF SYSTEMS: Full 14 system review of systems performed and negative with exception of: Headache dizziness joint pain aching muscles murmur.   ALLERGIES: Allergies  Allergen Reactions  . Penicillins Hives, Swelling and Rash    Facial swelling Has patient had a PCN reaction causing immediate rash, facial/tongue/throat  swelling, SOB or lightheadedness with hypotension: Yes Has patient had a PCN reaction causing severe rash involving mucus membranes or skin necrosis: No Has patient had a PCN reaction that required hospitalization: No Has patient had a PCN reaction occurring within the last 10 years: No If all of the above answers are "NO", then may proceed with Cephalosporin use.    HOME MEDICATIONS: Outpatient Medications Prior to Visit  Medication Sig Dispense Refill  . albuterol (VENTOLIN HFA) 108 (90 Base) MCG/ACT inhaler Inhale into the lungs.    Marland Kitchen amLODipine (NORVASC) 5 MG tablet TAKE 1 TABLET BY MOUTH EVERY DAY 90 tablet 3  . aspirin EC 81 MG tablet Take 81 mg by mouth daily as needed (pain).    Marland Kitchen aspirin-acetaminophen-caffeine (EXCEDRIN MIGRAINE) 250-250-65 MG tablet Take 2 tablets by mouth every 8 (eight) hours as needed for headache or migraine.    . Aspirin-Salicylamide-Caffeine (BC HEADACHE POWDER PO) Take 1-2 packets by mouth 2 (two) times daily as needed (migraine).    . Cholecalciferol (VITAMIN D3) 1.25 MG (50000 UT) CAPS TAKE 1 CAPSULE BY MOUTH EVERY 7 (SEVEN) DAYS. 12 capsule 2  . hydrochlorothiazide (HYDRODIURIL) 25 MG tablet Take 1 tablet (25 mg total) by mouth daily. 90 tablet 1  . mineral oil liquid See admin instructions. Instill 4-5 drops in to left ear as needed for wax buildup    . Multiple Vitamin (MULTIVITAMIN WITH MINERALS) TABS tablet Take 1 tablet by mouth daily.    . peppermint oil liquid Apply topically as needed (headache/migraine).    . SUMAtriptan (IMITREX) 50 MG tablet Take 1 tablet (50 mg total) by mouth every 2 (two) hours as needed for migraine.  May repeat in 2 hours x 1 if needed. 10 tablet 0  . topiramate (TOPAMAX) 50 MG tablet Take 1 tablet (50 mg total) by mouth 2 (two) times daily. 60 tablet 1   No facility-administered medications prior to visit.    PAST MEDICAL HISTORY: Past Medical History:  Diagnosis Date  . Allergy    seasonal allergies  . Arthritis    . Asthma    childhood/hx of/ no issues recently  . GERD (gastroesophageal reflux disease)    PRN OTC  . Hypertension    on meds  . Migraine   . Seasonal allergies     PAST SURGICAL HISTORY: Past Surgical History:  Procedure Laterality Date  . TOOTH EXTRACTION     wisdom teeth and additional teeth removed  . TUBAL LIGATION      FAMILY HISTORY: Family History  Problem Relation Age of Onset  . Asthma Mother   . Hypertension Mother   . Miscarriages / [place] Mother   . Stroke Mother   . Pneumonia Father   . Heart disease Sister   . Hypertension Sister   . Stroke Sister   . Kidney disease Daughter   . Early death Son   . Kidney disease Son   . Other Brother        drowned  . Colon cancer Neg Hx   . Colon polyps Neg Hx   . Esophageal cancer Neg Hx   . Stomach cancer Neg Hx   . Rectal cancer Neg Hx     SOCIAL HISTORY:  Social History   Socioeconomic History  . Marital status: Married    Spouse name: Not on file  . Number of children: Not on file  . Years of education: college  . Highest education level: Not on file  Occupational History    Comment: na  Tobacco Use  . Smoking status: Former Research scientist (life sciences)  . Smokeless tobacco: Never Used  Vaping Use  . Vaping Use: Not on file  Substance and Sexual Activity  . Alcohol use: No  . Drug use: No  . Sexual activity: Not Currently  Other Topics Concern  . Not on file  Social History Narrative   Married   Education- college   Children - 3 living   Social Determinants of Health   Financial Resource Strain: Low Risk   . Difficulty of Paying Living Expenses: Not hard at all  Food Insecurity: No Food Insecurity  . Worried About Charity fundraiser in the Last Year: Never true  . Ran Out of Food in the Last Year: Never true  Transportation Needs: No Transportation Needs  . Lack of Transportation (Medical): No  . Lack of Transportation (Non-Medical): No  Physical Activity: Inactive  . Days of Exercise per Week:  0 days  . Minutes of Exercise per Session: 0 min  Stress: No Stress Concern Present  . Feeling of Stress : Not at all  Social Connections: Not on file  Intimate Partner Violence: Not At Risk  . Fear of Current or Ex-Partner: No  . Emotionally Abused: No  . Physically Abused: No  . Sexually Abused: No     PHYSICAL EXAM  GENERAL EXAM/CONSTITUTIONAL: Vitals:  Vitals:   09/14/20 1024  BP: (!) 151/91  Pulse: 82  Weight: 188 lb (85.3 kg)  Height: 5\' 2"  (1.575 m)   Body mass index is 34.39 kg/m. No exam data present  Patient is in no distress; well developed, nourished and groomed; neck is supple  CARDIOVASCULAR:  Examination of carotid arteries is normal; no carotid bruits  Regular rate and rhythm, no murmurs  Examination of peripheral vascular system by observation and palpation is normal  EYES:  Ophthalmoscopic exam of optic discs and posterior segments is normal; no papilledema or hemorrhages  MUSCULOSKELETAL:  Gait, strength, tone, movements noted in Neurologic exam below  NEUROLOGIC: MENTAL STATUS:  MMSE - Mini Mental State Exam 03/05/2020 02/28/2019  Orientation to time 5 5  Orientation to Place 5 5  Registration 3 3  Attention/ Calculation 5 5  Recall 3 3  Language- repeat 1 1    awake, alert, oriented to person, place and time  recent and remote memory intact  normal attention and concentration  language fluent, comprehension intact, naming intact,   fund of knowledge appropriate  CRANIAL NERVE:   2nd - no papilledema on fundoscopic exam  2nd, 3rd, 4th, 6th - pupils equal and reactive to light, visual fields full to confrontation, extraocular muscles intact, no nystagmus  5th - facial sensation symmetric  7th - facial strength symmetric  8th - hearing intact  9th - palate elevates symmetrically, uvula midline  11th - shoulder shrug symmetric  12th - tongue protrusion midline  MOTOR:   normal bulk and tone, full strength in the  BUE, BLE  SENSORY:   normal and symmetric to light touch,  temperature, vibration  COORDINATION:   finger-nose-finger, fine finger movements normal  REFLEXES:   deep tendon reflexes present and symmetric  GAIT/STATION:   narrow based gait    DIAGNOSTIC DATA (LABS, IMAGING, TESTING) - I reviewed patient records, labs, notes, testing and imaging myself where available.  Lab Results  Component Value Date   WBC 4.4 03/05/2020   HGB 12.3 03/05/2020   HCT 36.4 03/05/2020   MCV 90.3 03/05/2020   PLT 252.0 03/05/2020      Component Value Date/Time   NA 140 03/05/2020 0858   K 4.4 03/05/2020 0858   CL 101 03/05/2020 0858   CO2 33 (H) 03/05/2020 0858   GLUCOSE 96 03/05/2020 0858   BUN 12 03/05/2020 0858   CREATININE 0.99 03/05/2020 0858   CALCIUM 10.1 03/05/2020 0858   PROT 7.6 03/05/2020 0858   ALBUMIN 4.2 03/05/2020 0858   AST 18 03/05/2020 0858   ALT 13 03/05/2020 0858   ALKPHOS 75 03/05/2020 0858   BILITOT 0.4 03/05/2020 0858   GFRNONAA 53 (L) 12/31/2017 1452   GFRAA >60 12/31/2017 1452   Lab Results  Component Value Date   CHOL 233 (H) 03/05/2020   HDL 76.00 03/05/2020   LDLCALC 134 (H) 03/05/2020   TRIG 113.0 03/05/2020   CHOLHDL 3 03/05/2020   Lab Results  Component Value Date   HGBA1C 5.8 09/17/2019   Lab Results  Component Value Date   VITAMINB12 903 02/06/2018   Lab Results  Component Value Date   TSH 1.32 09/03/2017    08/13/17 CT head [I reviewed images myself and agree with interpretation. -VRP]  - normal     ASSESSMENT AND PLAN  70 y.o. year old female here with:   Dx:  1. Chronic right-sided low back pain with bilateral sciatica      PLAN:  LOW BACK PAIN / RIGHT LEG PAIN (right leg weakness, groin numbness) - check MRI lumbar spine - refer to PT  MIGRAINE WITH AURA - continue topiramate + sumatriptan  Orders Placed This Encounter  Procedures  . MR LUMBAR SPINE WO CONTRAST  . Ambulatory referral to Physical Therapy  Return for pending if symptoms worsen or fail to improve.    Penni Bombard, MD 2/68/3419, 62:22 AM Certified in Neurology, Neurophysiology and Neuroimaging  [facility] [address], [place], [place] [address] [phone]

## 2020-09-14 NOTE — Telephone Encounter (Signed)
Mcarthur Rossetti Josem Kaufmann: [phone] (exp. 09/14/20 to 10/14/20) order sent to GI. They will reach out to the patient to schedule.

## 2020-10-13 ENCOUNTER — Other Ambulatory Visit: Payer: Self-pay

## 2020-10-13 ENCOUNTER — Ambulatory Visit: Payer: Medicare PPO | Attending: Diagnostic Neuroimaging | Admitting: Physical Therapy

## 2020-10-13 ENCOUNTER — Encounter: Payer: Self-pay | Admitting: Physical Therapy

## 2020-10-13 DIAGNOSIS — M545 Low back pain, unspecified: Secondary | ICD-10-CM | POA: Diagnosis not present

## 2020-10-13 DIAGNOSIS — M6281 Muscle weakness (generalized): Secondary | ICD-10-CM | POA: Diagnosis present

## 2020-10-13 DIAGNOSIS — R2689 Other abnormalities of gait and mobility: Secondary | ICD-10-CM | POA: Diagnosis present

## 2020-10-13 NOTE — Therapy (Signed)
[place], [place], [address] Phone: [phone]   Fax:  [phone]  Physical Therapy Evaluation  Patient Details  Name: Joann Drake MRN: [id number] Date of Birth: 1951-05-26 Referring Provider (PT): Penni Bombard, MD   Encounter Date: 10/13/2020   PT End of Session - 10/13/20 1353    Visit Number 1    Number of Visits 12    Date for PT Re-Evaluation 11/24/20    Authorization Type humana    PT Start Time 1100   pt arrived late (went to wrong clinic)   PT Stop Time 1130    PT Time Calculation (min) 30 min    Activity Tolerance Patient tolerated treatment well    Behavior During Therapy [facility] for tasks assessed/performed           Past Medical History:  Diagnosis Date  . Allergy    seasonal allergies  . Arthritis   . Asthma    childhood/hx of/ no issues recently  . GERD (gastroesophageal reflux disease)    PRN OTC  . Hypertension    on meds  . Migraine   . Seasonal allergies     Past Surgical History:  Procedure Laterality Date  . TOOTH EXTRACTION     wisdom teeth and additional teeth removed  . TUBAL LIGATION      There were no vitals filed for this visit.    Subjective Assessment - 10/13/20 1100    Subjective Pt reports chief complaint of bil low back pain with n/t in R LE (posterior mostly).  some n/t in groin region.  no bb changes.  n/t in groin is followed by neurologist.  Pt has had chonic low back pain with worsening sxs of over last 6 months.  insidious onset.  She reports history of chronic falls since wosening of sxs.  She feels like her R leg "just goes limp sometimes".  no sudden weight loss or gain    Limitations Sitting;Standing;Walking    How long can you sit comfortably? < .5 hours    How long can you stand comfortably? < .5 hour    How long can you walk comfortably? unable to walk to grocery shop    Diagnostic tests MRI ordered    Patient Stated Goals  be able to walk and not fall    Currently in Pain? Yes    Pain Score 5     Pain Location Hip    Pain Orientation Posterior    Pain Descriptors / Indicators Burning;Constant;Shooting    Pain Type Chronic pain    Pain Radiating Towards foot    Pain Onset More than a month ago    Pain Frequency Constant    Aggravating Factors  standing, walking    Pain Relieving Factors pain pills    Effect of Pain on Daily Activities inability to walk and stand for long periods              [facility] PT Assessment - 10/13/20 0001      Assessment   Medical Diagnosis Chronic right-sided low back pain with bilateral sciatica (M54.41, M54.42, G89.29)    Referring Provider (PT) Penni Bombard, MD    Onset Date/Surgical Date 10/14/19    Hand Dominance Right    Next MD Visit unknown    Prior Therapy none      Precautions   Precaution Comments Fall risk      Balance Screen   Has the patient  fallen in the past 6 months Yes    How many times? 2    Has the patient had a decrease in activity level because of a fear of falling?  Yes    Is the patient reluctant to leave their home because of a fear of falling?  Yes      [place] residence    Living Arrangements Spouse/significant other;Children    Available Help at Discharge Family;Friend(s)    Type of [place] Two level    Alternate Level Stairs-Number of Steps 10    Alternate Level Stairs-Rails Right    Home Equipment Walker - 2 wheels      Prior Function   Level of Independence Independent    Vocation Requirements babysitting      Observation/Other Assessments   Observations anterior pelvic tilt      Sensation   Light Touch --   dermatomal testing was unremarkable, but sxs are transient per pt report.  Pt reports sesory disruption along L5 and entire posterior R LE to foot at times.     Deep Tendon Reflexes   DTR Assessment Site --   R sided DTR LE diminished; clonus and babinski (-)      AROM   Lumbar Flexion WNL - gowers sign    Lumbar Extension WNL    Lumbar - Right Side Bend reduced by 25%    Lumbar - Left Side Bend reduced by 25%    Lumbar - Right Rotation reduced by 50%    Lumbar - Left Rotation reduced by 50%      Strength   Right Hip Flexion 3+/5    Right Hip ABduction 3/5    Left Hip Flexion 4/5    Left Hip ABduction 3+/5    Right Knee Flexion 3+/5    Right Knee Extension 3+/5    Left Knee Flexion 4/5    Left Knee Extension 4/5    Right Ankle Dorsiflexion 3/5    Right Ankle Plantar Flexion 4/5    Left Ankle Dorsiflexion 4/5    Left Ankle Plantar Flexion 4/5      Ambulation/Gait   Gait Comments antalgic gait                      Objective measurements completed on examination: See above findings.                 PT Short Term Goals - 10/13/20 1359      PT SHORT TERM GOAL #1   Title [employer] will be >75% HEP compliant within 3 weeks to facilitate carryover between sessions and move towards eventual independent management of their condition.    Time 3    Period Weeks    Target Date 11/03/20                     Plan - 10/13/20 1348    Clinical Impression Statement Joann Drake is a 70 y.o. female who presents to clinic with signs and sxs consistent with low back pain with radiculopathy.  Consistent with LMN impingment.  Unfortunately, d/t pt arriving late exam was extremely limited.  She does have general R LE>L LE weakness and profound weakness with R L4 myotome.  Pt does have concerning report of some n/t in groin, but this is now chronic, known to neurologist, and there are not BB changes.Exam will have to be  resumed at next appt.  Pt presents with pain and notable deficits in: strength, lumbar ROM, gait and balance.  Pt is limited functionally in community ambulation, driving, and heavy household chores .  Pt will benefit from skilled therapy to address pain and the listed deficits in  order to achieve functional goals, enable safety and independence in completion of daily tasks, and return to PLOF.    Examination-Activity Limitations Stairs;Stand;Sit;Sleep;Squat;Locomotion Level;Bend    Examination-Participation Restrictions Church;Community Activity;Occupation    Stability/Clinical Decision Making Stable/Uncomplicated    Clinical Decision Making Low    Rehab Potential Good    PT Frequency 2x / week    PT Duration 6 weeks    PT Treatment/Interventions ADLs/Self Care Home Management;Aquatic Therapy;Iontophoresis 4mg /ml Dexamethasone;Neuromuscular re-education;Therapeutic exercise;Manual techniques;Therapeutic activities;Gait training;Joint Manipulations;Spinal Manipulations;Dry needling    PT Next Visit Plan assess: balance, neural tension, hip flexor mobility, spinal mobility, directional preference.  Create 60'' STS goal, FOTO goal, balance goal    PT Home Exercise Plan update next session    Recommended Other Services reccomended she contact her MD about pending MRI    Consulted and Agree with Plan of Care Patient           Patient will benefit from skilled therapeutic intervention in order to improve the following deficits and impairments:  Abnormal gait,Decreased activity tolerance,Decreased endurance,Decreased strength,Pain,Decreased balance  Visit Diagnosis: Low back pain, unspecified back pain laterality, unspecified chronicity, unspecified whether sciatica present  Muscle weakness  Balance problem  Other abnormalities of gait and mobility     Problem List Patient Active Problem List   Diagnosis Date Noted  . Hyperlipidemia 08/09/2020  . Paresthesia of right leg 08/09/2020  . Class 1 obesity due to excess calories with body mass index (BMI) of 34.0 to 34.9 in adult 03/11/2019  . Conductive hearing loss of left ear 02/12/2018  . Vitamin D deficiency 02/07/2018  . Fatigue 02/06/2018  . Dizziness 02/06/2018  . Migraine 02/06/2018  . Essential  hypertension 09/03/2017  . Prediabetes 09/03/2017   Referring diagnosis? Chronic right-sided low back pain with bilateral sciatica [M54.41, M54.42, G89.29] Treatment diagnosis? (if different than referring diagnosis)  What was this (referring dx) caused by? []  Surgery []  Fall [x]  Ongoing issue []  Arthritis []  Other: ____________  Laterality: [x]  Rt []  Lt [x]  Both  Check all possible CPT codes:      [x]  97110 (Therapeutic Exercise)  []  92507 (SLP Treatment)  [x]  97112 (Neuro Re-ed)   []  92526 (Swallowing Treatment)   [x]  97116 (Gait Training)   []  [id number] (Cognitive Training, 1st 15 minutes) [x]  97140 (Manual Therapy)   []  97130 (Cognitive Training, each add'l 15 minutes)  [x]  97530 (Therapeutic Activities)  []  Other, List CPT Code ____________    [x]  51025 (Self Care)       []  All codes above (97110 - 97535)  []  97012 (Mechanical Traction)  []  97014 (E-stim Unattended)  []  97032 (E-stim manual)  []  97033 (Ionto)  []  97035 (Ultrasound)  []  97760 (Orthotic Fit) []  [id number] (Physical Performance Training) []  [id number] (Aquatic Therapy) []  97034 (Contrast Bath) []  [id number] (Paraffin) []  97597 (Wound Care 1st 20 sq cm) []  97598 (Wound Care each add'l 20 sq cm) []  97016 (Vasopneumatic Device) []  [id number] Comptroller) []  [id number] (Prosthetic Training)   Mathis Dad 10/13/2020, 1:59 PM  [facility] [address] [place], [place], [address] Phone: [phone]   Fax:  [phone]  Name: Sharnise Blough MRN: [id number] Date of Birth: 04-Jun-1950

## 2020-10-20 ENCOUNTER — Telehealth: Payer: Self-pay

## 2020-10-20 NOTE — Telephone Encounter (Signed)
Attempted to reach patient to discuss. Call went straight to voicemail and the mailbox is full.

## 2020-10-20 NOTE — Telephone Encounter (Signed)
[facility] Night - Client Nonclinical Telephone Record AccessNurse Client [place] Night - Client Client Site [place] Physician Loura Pardon - MD Contact Type Call Who Is Calling Patient / Member / Family / Caregiver Caller Name Sharri Loya Caller Phone Number [phone] Patient Name Joann Drake Patient DOB [birth year] Call Type Message Only Information Provided Reason for Call Request for General Office Information Initial Comment Caller says that she wants to check when her appt., and what time. Disp. Time Disposition Final User 10/19/2020 5:15:56 PM General Information Provided Yes Norton Blizzard Call Closed By: Norton Blizzard Transaction Date/Time: 10/19/2020 5:13:41 PM (ET)

## 2020-10-27 ENCOUNTER — Other Ambulatory Visit: Payer: Self-pay

## 2020-10-27 ENCOUNTER — Ambulatory Visit: Payer: Medicare PPO | Attending: Diagnostic Neuroimaging | Admitting: Physical Therapy

## 2020-10-27 ENCOUNTER — Encounter: Payer: Self-pay | Admitting: Physical Therapy

## 2020-10-27 DIAGNOSIS — R2689 Other abnormalities of gait and mobility: Secondary | ICD-10-CM | POA: Diagnosis present

## 2020-10-27 DIAGNOSIS — M545 Low back pain, unspecified: Secondary | ICD-10-CM | POA: Diagnosis not present

## 2020-10-27 DIAGNOSIS — M6281 Muscle weakness (generalized): Secondary | ICD-10-CM | POA: Diagnosis present

## 2020-10-27 NOTE — Therapy (Signed)
[place], [place], [address] Phone: [phone]   Fax:  [phone]  Physical Therapy Treatment  Patient Details  Name: Joann Drake MRN: [id number] Date of Birth: 1950-11-09 Referring Provider (PT): Penni Bombard, MD   Encounter Date: 10/27/2020   PT End of Session - 10/27/20 1046    Visit Number 2    Number of Visits 12    Date for PT Re-Evaluation 11/24/20    Authorization Type humana    PT Start Time 1045    PT Stop Time 1130    PT Time Calculation (min) 45 min    Activity Tolerance Patient tolerated treatment well    Behavior During Therapy [facility] for tasks assessed/performed           Past Medical History:  Diagnosis Date  . Allergy    seasonal allergies  . Arthritis   . Asthma    childhood/hx of/ no issues recently  . GERD (gastroesophageal reflux disease)    PRN OTC  . Hypertension    on meds  . Migraine   . Seasonal allergies     Past Surgical History:  Procedure Laterality Date  . TOOTH EXTRACTION     wisdom teeth and additional teeth removed  . TUBAL LIGATION      There were no vitals filed for this visit.   Subjective Assessment - 10/27/20 1055    Subjective Pt continues to endorse R sided back pain with radiating down R LE.  She has been HEP compliant.    Limitations Sitting;Standing;Walking    How long can you sit comfortably? < .5 hours    How long can you stand comfortably? < .5 hour    How long can you walk comfortably? unable to walk to grocery shop    Diagnostic tests MRI ordered    Patient Stated Goals be able to walk and not fall    Currently in Pain? Yes    Pain Score 8     Pain Location Back    Pain Orientation Lower;Right    Pain Onset More than a month ago              [facility] PT Assessment - 10/27/20 0001      Observation/Other Assessments   Focus on Therapeutic Outcomes (FOTO)  33->51      Transfers   Comments 30'' STS: 8x       Ambulation/Gait   Ambulation Distance (Feet) 30 Feet    Gait velocity .91 m/s                         [facility] Adult PT Treatment/Exercise - 10/27/20 0001      Therapeutic Activites    Therapeutic Activities Other Therapeutic Activities    Other Therapeutic Activities collecting information for functional long term goals incuding FOTO, 30'' STS, and gait speed      Exercises   Exercises Lumbar;Knee/Hip      Lumbar Exercises: Stretches   Single Knee to Chest Stretch Limitations manual    Lower Trunk Rotation Limitations 20x    Pelvic Tilt Limitations 10x 5'' with belt as cue; 2x10 resisted pelvic tilt with foam roller and belt    Piriformis Stretch Limitations manual                    PT Short Term Goals - 10/13/20 1359      PT SHORT TERM GOAL #1  Title Joann Drake will be >75% HEP compliant within 3 weeks to facilitate carryover between sessions and move towards eventual independent management of their condition.    Time 3    Period Weeks    Target Date 11/03/20             PT Long Term Goals - 10/27/20 1416      PT LONG TERM GOAL #1   Title Joann Drake will improve FOTO score from 33 to 51 as a proxy for functional improvement    Target Date 12/08/20      PT LONG TERM GOAL #2   Title Joann Drake will improve 30'' STS (MCID 2) to >/= 12x to show improved LE strength and improved transfers.    Baseline 8x    Target Date 12/08/20      PT LONG TERM GOAL #3   Title Joann Drake will improve gait speed to 1.2 m/s (.1 m/s MCID) to show functional improvement in ambulation    Baseline .91    Target Date 12/08/20                 Plan - 10/27/20 1145    Clinical Impression Statement Pt tolerated session well.  A large portion was colecting information to create goals.  She required mod cuing for PPT, but was able to perform with good form after cuing. Pt reports drop in LBP from 8/10 to 5/10  after therapy.           Patient will benefit from skilled therapeutic intervention in order to improve the following deficits and impairments:     Visit Diagnosis: Low back pain, unspecified back pain laterality, unspecified chronicity, unspecified whether sciatica present  Muscle weakness  Balance problem  Other abnormalities of gait and mobility     Problem List Patient Active Problem List   Diagnosis Date Noted  . Hyperlipidemia 08/09/2020  . Paresthesia of right leg 08/09/2020  . Class 1 obesity due to excess calories with body mass index (BMI) of 34.0 to 34.9 in adult 03/11/2019  . Conductive hearing loss of left ear 02/12/2018  . Vitamin D deficiency 02/07/2018  . Fatigue 02/06/2018  . Dizziness 02/06/2018  . Migraine 02/06/2018  . Essential hypertension 09/03/2017  . Prediabetes 09/03/2017    Shearon Balo PT, DPT 10/27/20 2:19 PM  [facility] [address] [place], [place], [address] Phone: [phone]   Fax:  [phone]  Name: Joann Drake MRN: [id number] Date of Birth: 21-Mar-1951

## 2020-10-30 ENCOUNTER — Ambulatory Visit: Payer: Medicare PPO | Admitting: Physical Therapy

## 2020-10-30 ENCOUNTER — Other Ambulatory Visit: Payer: Self-pay

## 2020-10-30 ENCOUNTER — Encounter: Payer: Self-pay | Admitting: Physical Therapy

## 2020-10-30 VITALS — BP 149/90 | HR 80

## 2020-10-30 DIAGNOSIS — M6281 Muscle weakness (generalized): Secondary | ICD-10-CM

## 2020-10-30 DIAGNOSIS — R2689 Other abnormalities of gait and mobility: Secondary | ICD-10-CM

## 2020-10-30 DIAGNOSIS — M545 Low back pain, unspecified: Secondary | ICD-10-CM

## 2020-10-30 NOTE — Therapy (Addendum)
[place], [place], [address] Phone: [phone]   Fax:  [phone]  PHYSICAL THERAPY UNPLANNED DISCHARGE SUMMARY   Visits from Start of Care: 3  Current functional level related to goals / functional outcomes: Current status unknown   Remaining deficits: Current status unknown   Education / Equipment: Pt has not returned since visit listed below  Patient goals were not assessed. Patient is being discharged due to not returning since the last visit.  (the below note was addended to include the above D/C summary on 01/07/21)  Physical Therapy Treatment  Patient Details  Name: Joann Drake MRN: [id number] Date of Birth: 04-28-1951 Referring Provider (PT): Penni Bombard, MD   Encounter Date: 10/30/2020   PT End of Session - 10/30/20 0904     Visit Number 3    Number of Visits 12    Date for PT Re-Evaluation 11/24/20    Authorization Type humana    PT Start Time 1000    PT Stop Time 1045    PT Time Calculation (min) 45 min    Activity Tolerance Patient tolerated treatment well    Behavior During Therapy WFL for tasks assessed/performed             Past Medical History:  Diagnosis Date   Allergy    seasonal allergies   Arthritis    Asthma    childhood/hx of/ no issues recently   GERD (gastroesophageal reflux disease)    PRN OTC   Hypertension    on meds   Migraine    Seasonal allergies     Past Surgical History:  Procedure Laterality Date   TOOTH EXTRACTION     wisdom teeth and additional teeth removed   TUBAL LIGATION      Vitals:   10/30/20 0910  BP: (!) 149/90  Pulse: 80  SpO2: 99%     Subjective Assessment - 10/30/20 0904     Subjective Pt reports that she had a rough night last night d/t pain and did not sleep well.  She just got a lumbar brace which is somewhat helpful.  She also reports she is dizzy (light headed) at times (this has happened 2x  in last 2 weeks).    Limitations Sitting;Standing;Walking    How long can you sit comfortably? < .5 hours    How long can you stand comfortably? < .5 hour    How long can you walk comfortably? unable to walk to grocery shop    Diagnostic tests MRI ordered    Patient Stated Goals be able to walk and not fall    Pain Onset More than a month ago                               Joann Drake Adult PT Treatment/Exercise - 10/30/20 0001       Lumbar Exercises: Stretches   Single Knee to Chest Stretch Limitations manual    Lower Trunk Rotation Limitations 20x    Pelvic Tilt Limitations 10x 5'' with belt as cue; 2x10 resisted pelvic tilt with foam roller and belt    Piriformis Stretch Limitations manual      Lumbar Exercises: Supine   Clam Limitations alternating RTB - 3x10    Bridge Limitations 2x10    Other Supine Lumbar Exercises isometric ball squeeze - 10x 5''  PT Education - 10/30/20 [phone]     Education Details HEP, benefits of aquatic therapy    Person(s) Educated Patient    Methods Explanation    Comprehension Verbalized understanding;Returned demonstration              PT Short Term Goals - 10/13/20 1359       PT SHORT TERM GOAL #1   Title Joann Drake will be >75% HEP compliant within 3 weeks to facilitate carryover between sessions and move towards eventual independent management of their condition.    Time 3    Period Weeks    Target Date 11/03/20               PT Long Term Goals - 10/27/20 1416       PT LONG TERM GOAL #1   Title Joann Drake will improve FOTO score from 33 to 51 as a proxy for functional improvement    Target Date 12/08/20      PT LONG TERM GOAL #2   Title Joann Drake will improve 30'' STS (MCID 2) to >/= 12x to show improved LE strength and improved transfers.    Baseline 8x    Target Date 12/08/20      PT LONG TERM GOAL #3   Title Joann Drake  will improve gait speed to 1.2 m/s (.1 m/s MCID) to show functional improvement in ambulation    Baseline .91    Target Date 12/08/20                   Plan - 10/30/20 0933     Clinical Impression Statement Pt is progressing well with therapy.  No adverse events.  We were able to progress lumbar strengthening on mat today with no increase in sxs.  Issued new HEP and reccomended she look into walking in pool fo exercise; pt receptive.    PT Home Exercise Plan [id number]             Patient will benefit from skilled therapeutic intervention in order to improve the following deficits and impairments:     Visit Diagnosis: Low back pain, unspecified back pain laterality, unspecified chronicity, unspecified whether sciatica present  Muscle weakness  Balance problem  Other abnormalities of gait and mobility     Problem List Patient Active Problem List   Diagnosis Date Noted   Hyperlipidemia 08/09/2020   Paresthesia of right leg 08/09/2020   Class 1 obesity due to excess calories with body mass index (BMI) of 34.0 to 34.9 in adult 03/11/2019   Conductive hearing loss of left ear 02/12/2018   Vitamin D deficiency 02/07/2018   Fatigue 02/06/2018   Dizziness 02/06/2018   Migraine 02/06/2018   Essential hypertension 09/03/2017   Prediabetes 09/03/2017    Joann Drake PT, DPT 10/30/20 10:54 AM  [place] [facility] [address] [place], [place], [address] Phone: [phone]   Fax:  [phone]  Name: Joann Drake MRN: [id number] Date of Birth: January 03, 1951

## 2020-10-30 NOTE — Patient Instructions (Signed)
Access Code: [email or web address] URL: [email or web address] Date: 10/30/2020 Prepared by: Shearon Balo  Exercises Supine Posterior Pelvic Tilt - 2 x daily - 7 x weekly - 2 sets - 10 reps - 5'' hold Supine Single Knee to Chest Stretch - 2 x daily - 7 x weekly - 1 sets - 3 reps - 45 hold Supine Lower Trunk Rotation - 1 x daily - 7 x weekly - 1 sets - 20 reps - 3 hold Hooklying Isometric Clamshell - 1 x daily - 7 x weekly - 3 sets - 10 reps

## 2020-11-02 ENCOUNTER — Ambulatory Visit: Payer: Medicare PPO

## 2020-11-04 ENCOUNTER — Ambulatory Visit: Payer: Medicare PPO

## 2020-12-27 ENCOUNTER — Other Ambulatory Visit: Payer: Self-pay | Admitting: Family Medicine

## 2020-12-27 DIAGNOSIS — I10 Essential (primary) hypertension: Secondary | ICD-10-CM

## 2021-02-07 ENCOUNTER — Other Ambulatory Visit: Payer: Self-pay | Admitting: Diagnostic Neuroimaging

## 2021-02-08 ENCOUNTER — Telehealth: Payer: Self-pay | Admitting: Family Medicine

## 2021-02-08 DIAGNOSIS — I10 Essential (primary) hypertension: Secondary | ICD-10-CM

## 2021-02-10 MED ORDER — AMLODIPINE BESYLATE 5 MG PO TABS: 5.0000 mg | ORAL_TABLET | Freq: Every day | ORAL | 0 refills | Status: DC

## 2021-03-01 ENCOUNTER — Telehealth: Payer: Self-pay | Admitting: Family Medicine

## 2021-03-01 DIAGNOSIS — I10 Essential (primary) hypertension: Secondary | ICD-10-CM

## 2021-03-01 DIAGNOSIS — E559 Vitamin D deficiency, unspecified: Secondary | ICD-10-CM

## 2021-03-01 DIAGNOSIS — E7800 Pure hypercholesterolemia, unspecified: Secondary | ICD-10-CM

## 2021-03-01 DIAGNOSIS — Z125 Encounter for screening for malignant neoplasm of prostate: Secondary | ICD-10-CM | POA: Insufficient documentation

## 2021-03-01 DIAGNOSIS — R7303 Prediabetes: Secondary | ICD-10-CM

## 2021-03-01 NOTE — Telephone Encounter (Signed)
lab

## 2021-03-01 NOTE — Telephone Encounter (Signed)
-----   Message from Ellamae Sia sent at 02/16/2021  7:52 AM EDT ----- Regarding: Lab orders for Friday, 10.7.22 Patient is scheduled for CPX labs, please order future labs, Thanks , Karna Christmas

## 2021-03-04 ENCOUNTER — Other Ambulatory Visit: Payer: Medicare PPO

## 2021-03-11 ENCOUNTER — Encounter: Payer: Medicare PPO | Admitting: Family Medicine

## 2021-03-11 DIAGNOSIS — Z0000 Encounter for general adult medical examination without abnormal findings: Secondary | ICD-10-CM | POA: Insufficient documentation

## 2021-03-11 NOTE — Progress Notes (Deleted)
   Subjective:    Patient ID: Joann Drake, female    DOB: 1951/05/09, 70 y.o.   MRN: [id number]  This visit occurred during the SARS-CoV-2 public health emergency.  Safety protocols were in place, including screening questions prior to the visit, additional usage of staff PPE, and extensive cleaning of exam room while observing appropriate contact time as indicated for disinfecting solutions.   HPI Pt presents for amw and health mt visit   I have personally reviewed the Medicare Annual Wellness questionnaire and have noted 1. The patient's medical and social history 2. Their use of alcohol, tobacco or illicit drugs 3. Their current medications and supplements 4. The patient's functional ability including ADL's, fall risks, home safety risks and hearing or visual             impairment. 5. Diet and physical activities 6. Evidence for depression or mood disorders  The patients weight, height, BMI have been recorded in the chart and visual acuity is per eye clinic.  I have made referrals, counseling and provided education to the patient based review of the above and I have provided the pt with a written personalized care plan for preventive services. Reviewed and updated provider list, see scanned forms.  See scanned forms.  Routine anticipatory guidance given to patient.  See health maintenance. Colon cancer screening  colonoscopy 2/22 -benign polyp /10 y recall Breast cancer screening  mammogram 12/19 -bx showed fibroadenoma with calcifications  Self breast exam Flu vaccine  Tetanus vaccine -postpones for ins Pneumovax up to date  Covid immunized  Zoster vaccine Dexa 12/19 , bmd in the normal range  Falls Fractures Supplements Exercise   Prostate cancer screening Advance directive Cognitive function addressed- see scanned forms- and if abnormal then additional documentation follows.   PMH and SH reviewed  Meds, vitals, and allergies reviewed.   ROS: See HPI.   Otherwise negative.    Weight :  Hearing/vision:  PHQ:  ADLs  Functionality:  Care team : Margalit Leece-pcp [place]-neuro Cirigliano- GI  HTN   Hctz 25 mg daily  Amlodipine 5 mg daily   Prediabetes Lab Results  Component Value Date   HGBA1C 5.8 09/17/2019   Due for labs   Hyperlipidemia Lab Results  Component Value Date   CHOL 233 (H) 03/05/2020   HDL 76.00 03/05/2020   LDLCALC 134 (H) 03/05/2020   TRIG 113.0 03/05/2020   CHOLHDL 3 03/05/2020        Review of Systems     Objective:   Physical Exam        Assessment & Plan:

## 2021-04-25 ENCOUNTER — Encounter (HOSPITAL_COMMUNITY): Payer: Self-pay

## 2021-04-25 ENCOUNTER — Other Ambulatory Visit: Payer: Self-pay

## 2021-04-25 ENCOUNTER — Ambulatory Visit (HOSPITAL_COMMUNITY)
Admission: EM | Admit: 2021-04-25 | Discharge: 2021-04-25 | Disposition: A | Discharge status: Home / Self Care | Payer: Medicare PPO | Attending: Student | Admitting: Student

## 2021-04-25 DIAGNOSIS — J09X2 Influenza due to identified novel influenza A virus with other respiratory manifestations: Secondary | ICD-10-CM | POA: Diagnosis not present

## 2021-04-25 DIAGNOSIS — J4521 Mild intermittent asthma with (acute) exacerbation: Secondary | ICD-10-CM

## 2021-04-25 MED ORDER — PREDNISONE 20 MG PO TABS: 40.0000 mg | ORAL_TABLET | Freq: Every day | ORAL | 0 refills | Status: AC

## 2021-04-25 NOTE — Discharge Instructions (Addendum)
-  Prednisone, 2 pills taken at the same time for 5 days in a row.  Try taking this earlier in the day as it can give you energy. Avoid NSAIDs like ibuprofen and alleve while taking this medication as they can increase your risk of stomach upset and even GI bleeding when in combination with a steroid. You can continue tylenol (acetaminophen) up to 1000mg  3x daily. -Albuterol inhaler as needed for cough, wheezing, shortness of breath, 1 to 2 puffs every 6 hours as needed. -For fevers/chills, bodyaches, headaches- You can take Tylenol up to 1000 mg 3 times daily, and ibuprofen up to 600 mg 3 times daily with food.  You can take these together, or alternate every 3-4 hours. -Drink plenty of water/gatorade and get plenty of rest -With a virus, you're typically contagious for 5-7 days, or as long as you're having fevers.  -Come back and see [place] if things are getting worse instead of better, like shortness of breath, chest pain, fevers and chills that are getting higher instead of lower and do not come down with Tylenol or ibuprofen, etc.

## 2021-04-25 NOTE — ED Provider Notes (Signed)
[place]    CSN: [id number] Arrival date & time: 04/25/21  3818      History   Chief Complaint Chief Complaint  Patient presents with   Fever   Cough   Headache    HPI Shereen Tarini Carrier is a 70 y.o. female presenting with flulike illness for 5 days.  Medical history asthma, currently is requiring her albuterol inhaler more than normal, states she has enough of this at home.  Describes throbbing headaches, lightheadedness with ambulation, nonproductive cough, subjective fevers and chills.  Mucinex provides some relief.  Exposure to family members with similar symptoms, they have not had testing.  She does require a note for work.  Denies shortness of breath, chest pain.  HPI  Past Medical History:  Diagnosis Date   Allergy    seasonal allergies   Arthritis    Asthma    childhood/hx of/ no issues recently   GERD (gastroesophageal reflux disease)    PRN OTC   Hypertension    on meds   Migraine    Seasonal allergies     Patient Active Problem List   Diagnosis Date Noted   Medicare annual wellness visit, subsequent 03/11/2021   Hyperlipidemia 08/09/2020   Paresthesia of right leg 08/09/2020   Class 1 obesity due to excess calories with body mass index (BMI) of 34.0 to 34.9 in adult 03/11/2019   Conductive hearing loss of left ear 02/12/2018   Vitamin D deficiency 02/07/2018   Fatigue 02/06/2018   Dizziness 02/06/2018   Migraine 02/06/2018   Essential hypertension 09/03/2017   Prediabetes 09/03/2017    Past Surgical History:  Procedure Laterality Date   TOOTH EXTRACTION     wisdom teeth and additional teeth removed   TUBAL LIGATION      OB History   No obstetric history on file.      Home Medications    Prior to Admission medications   Medication Sig Start Date End Date Taking? Authorizing Provider  predniSONE (DELTASONE) 20 MG tablet Take 2 tablets (40 mg total) by mouth daily for 5 days. Take with breakfast or lunch. Avoid NSAIDs  (ibuprofen, etc) while taking this medication. 04/25/21 04/30/21 Yes Hazel Sams, PA-C  albuterol (VENTOLIN HFA) 108 (90 Base) MCG/ACT inhaler Inhale into the lungs. 06/29/20 08/10/23  [provider]  amLODipine (NORVASC) 5 MG tablet Take 1 tablet (5 mg total) by mouth daily. 02/10/21   Tower, Wynelle Fanny, MD  aspirin EC 81 MG tablet Take 81 mg by mouth daily as needed (pain).    [provider]  aspirin-acetaminophen-caffeine (EXCEDRIN MIGRAINE) [phone] MG tablet Take 2 tablets by mouth every 8 (eight) hours as needed for headache or migraine.    [provider]  Aspirin-Salicylamide-Caffeine (BC HEADACHE POWDER PO) Take 1-2 packets by mouth 2 (two) times daily as needed (migraine).    [provider]  Cholecalciferol (VITAMIN D3) 1.25 MG (50000 UT) CAPS TAKE 1 CAPSULE BY MOUTH EVERY 7 (SEVEN) DAYS. 08/09/20   Tower, Wynelle Fanny, MD  hydrochlorothiazide (HYDRODIURIL) 25 MG tablet TAKE 1 TABLET (25 MG TOTAL) BY MOUTH DAILY. 12/28/20   Tower, Wynelle Fanny, MD  mineral oil liquid See admin instructions. Instill 4-5 drops in to left ear as needed for wax buildup    [provider]  Multiple Vitamin (MULTIVITAMIN WITH MINERALS) TABS tablet Take 1 tablet by mouth daily.    [provider]  peppermint oil liquid Apply topically as needed (headache/migraine).    [provider]  SUMAtriptan (IMITREX) 50 MG tablet Take 1 tablet (50 mg total) by mouth every 2 (two) hours as needed for migraine. May repeat in 2 hours x 1 if needed. 02/25/18   Elby Beck, FNP  topiramate (TOPAMAX) 50 MG tablet Take 1 tablet (50 mg total) by mouth 2 (two) times daily. 02/25/18   Elby Beck, FNP    Family History Family History  Problem Relation Age of Onset   Asthma Mother    Hypertension Mother    Miscarriages / [place] Mother    Stroke Mother    Pneumonia Father    Heart disease Sister    Hypertension Sister    Stroke Sister    Kidney disease  Daughter    Early death Son    Kidney disease Son    Other Brother        drowned   Colon cancer Neg Hx    Colon polyps Neg Hx    Esophageal cancer Neg Hx    Stomach cancer Neg Hx    Rectal cancer Neg Hx     Social History Social History   Tobacco Use   Smoking status: Former   Smokeless tobacco: Never  Substance Use Topics   Alcohol use: No   Drug use: No     Allergies   Penicillins   Review of Systems Review of Systems  Constitutional:  Negative for appetite change, chills and fever.  HENT:  Positive for congestion. Negative for ear pain, rhinorrhea, sinus pressure, sinus pain and sore throat.   Eyes:  Negative for redness and visual disturbance.  Respiratory:  Positive for cough. Negative for chest tightness, shortness of breath and wheezing.   Cardiovascular:  Negative for chest pain and palpitations.  Gastrointestinal:  Negative for abdominal pain, constipation, diarrhea, nausea and vomiting.  Genitourinary:  Negative for dysuria, frequency and urgency.  Musculoskeletal:  Negative for myalgias.  Neurological:  Negative for dizziness, weakness and headaches.  Psychiatric/Behavioral:  Negative for confusion.   All other systems reviewed and are negative.   Physical Exam Triage Vital Signs ED Triage Vitals [04/25/21 0928]  Enc Vitals Group     BP 131/85     Pulse Rate 65     Resp 17     Temp 97.8 F (36.6 C)     Temp Source Oral     SpO2 98 %     Weight      Height      Head Circumference      Peak Flow      Pain Score 3     Pain Loc      Pain Edu?      Excl. in [place]?    No data found.  Updated Vital Signs BP 131/85 (BP Location: Right Arm)   Pulse 65   Temp 97.8 F (36.6 C) (Oral)   Resp 17   SpO2 98%   Visual Acuity Right Eye Distance:   Left Eye Distance:   Bilateral Distance:    Right Eye Near:   Left Eye Near:    Bilateral Near:     Physical Exam Vitals reviewed.  Constitutional:      General: She is not in acute distress.     Appearance: Normal appearance. She is ill-appearing.  HENT:     Head: Normocephalic and atraumatic.     Right Ear: Tympanic membrane, ear canal and external ear normal. No tenderness. No middle ear effusion. There is no impacted cerumen. Tympanic membrane  is not perforated, erythematous, retracted or bulging.     Left Ear: Tympanic membrane, ear canal and external ear normal. No tenderness.  No middle ear effusion. There is no impacted cerumen. Tympanic membrane is not perforated, erythematous, retracted or bulging.     Nose: Nose normal. No congestion.     Mouth/Throat:     Mouth: Mucous membranes are moist.     Pharynx: Uvula midline. No oropharyngeal exudate or posterior oropharyngeal erythema.  Eyes:     Extraocular Movements: Extraocular movements intact.     Pupils: Pupils are equal, round, and reactive to light.  Cardiovascular:     Rate and Rhythm: Normal rate and regular rhythm.     Heart sounds: Normal heart sounds.  Pulmonary:     Effort: Pulmonary effort is normal.     Breath sounds: Normal breath sounds. No decreased breath sounds, wheezing, rhonchi or rales.  Abdominal:     Palpations: Abdomen is soft.     Tenderness: There is no abdominal tenderness. There is no guarding or rebound.  Lymphadenopathy:     Cervical: No cervical adenopathy.     Right cervical: No superficial cervical adenopathy.    Left cervical: No superficial cervical adenopathy.  Neurological:     General: No focal deficit present.     Mental Status: She is alert and oriented to person, place, and time.  Psychiatric:        Mood and Affect: Mood normal.        Behavior: Behavior normal.        Thought Content: Thought content normal.        Judgment: Judgment normal.     UC Treatments / Results  Labs (all labs ordered are listed, but only abnormal results are displayed) Labs Reviewed - No data to display  EKG   Radiology No results found.  Procedures Procedures (including critical care  time)  Medications Ordered in UC Medications - No data to display  Initial Impression / Assessment and Plan / UC Course  I have reviewed the triage vital signs and the nursing notes.  Pertinent labs & imaging results that were available during my care of the patient were reviewed by me and considered in my medical decision making (see chart for details).     This patient is a very pleasant 70 y.o. year old female presenting with suspected influenza. Today this pt is afebrile nontachycardic nontachypneic, oxygenating well on room air, no wheezes rhonchi or rales.   Rapid influenza deferred given duration of symptoms. She is out of the tamiflu window.   Prednisone as below, continue albuterol inhaler.   Level 4 for acute exacerbation of chronic condition and prescription drug management.  Final Clinical Impressions(s) / UC Diagnoses   Final diagnoses:  Influenza due to identified novel influenza A virus with other respiratory manifestations  Mild intermittent asthma with acute exacerbation     Discharge Instructions      -Prednisone, 2 pills taken at the same time for 5 days in a row.  Try taking this earlier in the day as it can give you energy. Avoid NSAIDs like ibuprofen and alleve while taking this medication as they can increase your risk of stomach upset and even GI bleeding when in combination with a steroid. You can continue tylenol (acetaminophen) up to 1000mg  3x daily. -Albuterol inhaler as needed for cough, wheezing, shortness of breath, 1 to 2 puffs every 6 hours as needed. -For fevers/chills, bodyaches, headaches- You can take Tylenol up  to 1000 mg 3 times daily, and ibuprofen up to 600 mg 3 times daily with food.  You can take these together, or alternate every 3-4 hours. -Drink plenty of water/gatorade and get plenty of rest -With a virus, you're typically contagious for 5-7 days, or as long as you're having fevers.  -Come back and see [place] if things are getting worse  instead of better, like shortness of breath, chest pain, fevers and chills that are getting higher instead of lower and do not come down with Tylenol or ibuprofen, etc.      ED Prescriptions     Medication Sig Dispense Auth. Provider   predniSONE (DELTASONE) 20 MG tablet Take 2 tablets (40 mg total) by mouth daily for 5 days. Take with breakfast or lunch. Avoid NSAIDs (ibuprofen, etc) while taking this medication. 10 tablet Hazel Sams, PA-C      PDMP not reviewed this encounter.   Hazel Sams, PA-C 04/25/21 1040

## 2021-04-25 NOTE — ED Triage Notes (Signed)
Pt presents with headache, dizziness, coughing and fever x 5 days. Pt states she has taken Mucinex. States it has made her feel somewhat better. States the sxs come back.

## 2021-04-30 ENCOUNTER — Other Ambulatory Visit: Payer: Self-pay | Admitting: Family Medicine

## 2021-05-08 ENCOUNTER — Other Ambulatory Visit: Payer: Self-pay | Admitting: Family Medicine

## 2021-05-08 DIAGNOSIS — I10 Essential (primary) hypertension: Secondary | ICD-10-CM

## 2021-05-10 NOTE — Telephone Encounter (Signed)
Patient overdue for CPE, no showed on 03/11/21-please schedule for first available with Dr Glori Bickers and then will refill. Thank you

## 2021-05-10 NOTE — Telephone Encounter (Signed)
Called ms. Jara and got her scheduled for 12/21 @ 1130

## 2021-05-18 ENCOUNTER — Ambulatory Visit (INDEPENDENT_AMBULATORY_CARE_PROVIDER_SITE_OTHER): Payer: Medicare PPO | Admitting: Family Medicine

## 2021-05-18 ENCOUNTER — Other Ambulatory Visit: Payer: Self-pay

## 2021-05-18 ENCOUNTER — Encounter: Payer: Self-pay | Admitting: Family Medicine

## 2021-05-18 VITALS — BP 136/86 | HR 87 | Temp 97.3°F | Ht 60.5 in | Wt 187.4 lb

## 2021-05-18 DIAGNOSIS — R7303 Prediabetes: Secondary | ICD-10-CM | POA: Diagnosis not present

## 2021-05-18 DIAGNOSIS — R058 Other specified cough: Secondary | ICD-10-CM

## 2021-05-18 DIAGNOSIS — E7800 Pure hypercholesterolemia, unspecified: Secondary | ICD-10-CM

## 2021-05-18 DIAGNOSIS — Z0000 Encounter for general adult medical examination without abnormal findings: Secondary | ICD-10-CM | POA: Diagnosis not present

## 2021-05-18 DIAGNOSIS — Z23 Encounter for immunization: Secondary | ICD-10-CM

## 2021-05-18 DIAGNOSIS — E559 Vitamin D deficiency, unspecified: Secondary | ICD-10-CM | POA: Diagnosis not present

## 2021-05-18 DIAGNOSIS — R059 Cough, unspecified: Secondary | ICD-10-CM | POA: Insufficient documentation

## 2021-05-18 DIAGNOSIS — Z6835 Body mass index (BMI) 35.0-35.9, adult: Secondary | ICD-10-CM

## 2021-05-18 DIAGNOSIS — I10 Essential (primary) hypertension: Secondary | ICD-10-CM | POA: Diagnosis not present

## 2021-05-18 DIAGNOSIS — Z1231 Encounter for screening mammogram for malignant neoplasm of breast: Secondary | ICD-10-CM | POA: Insufficient documentation

## 2021-05-18 LAB — VITAMIN D 25 HYDROXY (VIT D DEFICIENCY, FRACTURES): VITD: 82.84 ng/mL (ref 30.00–100.00)

## 2021-05-18 LAB — COMPREHENSIVE METABOLIC PANEL
ALT: 18 U/L (ref 0–35)
AST: 21 U/L (ref 0–37)
Albumin: 4.2 g/dL (ref 3.5–5.2)
Alkaline Phosphatase: 80 U/L (ref 39–117)
BUN: 11 mg/dL (ref 6–23)
CO2: 31 mEq/L (ref 19–32)
Calcium: 10.2 mg/dL (ref 8.4–10.5)
Chloride: 99 mEq/L (ref 96–112)
Creatinine, Ser: 0.92 mg/dL (ref 0.40–1.20)
GFR: 63.57 mL/min (ref >60.00)
Glucose, Bld: 85 mg/dL (ref 70–99)
Potassium: 4.3 mEq/L (ref 3.5–5.1)
Sodium: 138 mEq/L (ref 135–145)
Total Bilirubin: 0.4 mg/dL (ref 0.2–1.2)
Total Protein: 7.7 g/dL (ref 6.0–8.3)

## 2021-05-18 LAB — LIPID PANEL
Cholesterol: 230 mg/dL — ABNORMAL HIGH (ref 0–200)
HDL: 72.4 mg/dL (ref >39.00)
LDL Cholesterol: 126 mg/dL — ABNORMAL HIGH (ref 0–99)
NonHDL: 157.39
Total CHOL/HDL Ratio: 3
Triglycerides: 159 mg/dL — ABNORMAL HIGH (ref 0.0–149.0)
VLDL: 31.8 mg/dL (ref 0.0–40.0)

## 2021-05-18 LAB — CBC WITH DIFFERENTIAL/PLATELET
Basophils Absolute: 0 10*3/uL (ref 0.0–0.1)
Basophils Relative: 0.4 % (ref 0.0–3.0)
Eosinophils Absolute: 0.2 10*3/uL (ref 0.0–0.7)
Eosinophils Relative: 4.1 % (ref 0.0–5.0)
HCT: 39.9 % (ref 36.0–46.0)
Hemoglobin: 13.2 g/dL (ref 12.0–15.0)
Lymphocytes Relative: 43.1 % (ref 12.0–46.0)
Lymphs Abs: 1.9 10*3/uL (ref 0.7–4.0)
MCHC: 33.1 g/dL (ref 30.0–36.0)
MCV: 91.3 fl (ref 78.0–100.0)
Monocytes Absolute: 0.5 10*3/uL (ref 0.1–1.0)
Monocytes Relative: 10.9 % (ref 3.0–12.0)
Neutro Abs: 1.8 10*3/uL (ref 1.4–7.7)
Neutrophils Relative %: 41.5 % — ABNORMAL LOW (ref 43.0–77.0)
Platelets: 269 10*3/uL (ref 150.0–400.0)
RBC: 4.37 Mil/uL (ref 3.87–5.11)
RDW: 13.7 % (ref 11.5–15.5)
WBC: 4.4 10*3/uL (ref 4.0–10.5)

## 2021-05-18 LAB — HEMOGLOBIN A1C: Hgb A1c MFr Bld: 6.1 % (ref 4.6–6.5)

## 2021-05-18 LAB — TSH: TSH: 0.74 u[IU]/mL (ref 0.35–5.50)

## 2021-05-18 MED ORDER — AZITHROMYCIN 250 MG PO TABS: ORAL_TABLET | ORAL | 0 refills | Status: DC

## 2021-05-18 MED ORDER — ALBUTEROL SULFATE HFA 108 (90 BASE) MCG/ACT IN AERS: 2.0000 | INHALATION_SPRAY | Freq: Four times a day (QID) | RESPIRATORY_TRACT | 5 refills | Status: DC | PRN

## 2021-05-18 NOTE — Assessment & Plan Note (Signed)
Discussed how this problem influences overall health and the risks it imposes  Reviewed plan for weight loss with lower calorie diet (via better food choices and also portion control or program like weight watchers) and exercise building up to or more than 30 minutes 5 days per week including some aerobic activity    

## 2021-05-18 NOTE — Assessment & Plan Note (Signed)
bp in fair control at this time  BP Readings from Last 1 Encounters:  05/18/21 136/86   No changes needed Most recent labs reviewed  Disc lifstyle change with low sodium diet and exercise  Plan to continue hctz 25 mg daily and amlodipine 5 mg daily  Labs ordered

## 2021-05-18 NOTE — Assessment & Plan Note (Signed)
S/p flu over 2 weeks ago  Reassuring exam  High risk for pna  tx with zpak today  Discussed symptom control F/u if no improvement

## 2021-05-18 NOTE — Progress Notes (Signed)
Subjective:    Patient ID: Joann Drake, female    DOB: 04-19-1951, 70 y.o.   MRN: [id number]  This visit occurred during the SARS-CoV-2 public health emergency.  Safety protocols were in place, including screening questions prior to the visit, additional usage of staff PPE, and extensive cleaning of exam room while observing appropriate contact time as indicated for disinfecting solutions.   HPI Pt presents for amw and health mt visit   I have personally reviewed the Medicare Annual Wellness questionnaire and have noted 1. The patient's medical and social history 2. Their use of alcohol, tobacco or illicit drugs 3. Their current medications and supplements 4. The patient's functional ability including ADL's, fall risks, home safety risks and hearing or visual             impairment. 5. Diet and physical activities 6. Evidence for depression or mood disorders  The patients weight, height, BMI have been recorded in the chart and visual acuity is per eye clinic.  I have made referrals, counseling and provided education to the patient based review of the above and I have provided the pt with a written personalized care plan for preventive services. Reviewed and updated provider list, see scanned forms.  See scanned forms.  Routine anticipatory guidance given to patient.  See health maintenance. Colon cancer screening  colonoscopy 06/2020 Breast cancer screening  mammogram 12/19 - had the bx was in 2020 normal  Self breast exam: no lumps or changes  Flu vaccine: today  Tetanus vaccine  postponed for insurance  Pneumovax completed  Covid immunized  Zoster vaccine: interested if covered  Dexa 2019- in the normal range  Falls-prone to falls, slipped when walking too fast,  pulled muscles in shoulder areas Fractures-none  Supplements high dose weekly  Exercise - usually walks 2-3 times per week / when not sick  She removed rugs    Advance directive: given materials to work  on  Cognitive function addressed- see scanned forms- and if abnormal then additional documentation follows.   Loves to read  Does socialize  Takes care of grand kids  (2) -helps with homework  61 and 52 year old   Needs more time for self care  Very busy   Husband has MS  Daughter has brain tumor (emergency surgery) and also dialysis   PMH and SH reviewed  Meds, vitals, and allergies reviewed.   ROS: See HPI.  Otherwise negative.    Weight : Wt Readings from Last 3 Encounters:  05/18/21 187 lb 6 oz (85 kg)  09/14/20 188 lb (85.3 kg)  08/09/20 186 lb 7 oz (84.6 kg)   35.99 kg/m  Had the flu a month ago and it is still lingering  Coughing  Some mucous  Taking otc stuff -makes her drowsy  Occ wheeze- albuterol /needs refill   Son had kidney failure and died of PEs   Hearing/vision: Hearing Screening   500Hz  1000Hz  2000Hz  4000Hz   Right ear 40 40 40 40  Left ear 40 40 40 40   Vision Screening   Right eye Left eye Both eyes  Without correction 20/40 20/50 20/50   With correction        PHQ: Depression screen [facility] 2/9 05/18/2021 03/05/2020 02/28/2019 02/25/2018 09/03/2017  Decreased Interest 0 0 0 0 0  Down, Depressed, Hopeless 0 0 0 0 0  PHQ - 2 Score 0 0 0 0 0  Altered sleeping - 0 0 - -  Tired, decreased energy -  0 0 - -  Change in appetite - 0 0 - -  Feeling bad or failure about yourself  - 0 0 - -  Trouble concentrating - 0 0 - -  Moving slowly or fidgety/restless - 0 0 - -  Suicidal thoughts - 0 0 - -  PHQ-9 Score - 0 0 - -  Difficult doing work/chores - Not difficult at all Not difficult at all - -   Despite stressors   ADLs: no help needed   Functionality: very good Cares for family   Care team : [facility] -neuro   HTN bp is stable today  No cp or palpitations or headaches or edema  No side effects to medicines  BP Readings from Last 3 Encounters:  05/18/21 136/86  04/25/21 131/85  10/30/20 (!) 149/90     Hctz 25 mg daily  Amlodipine 5 mg  daily   H/o vit D deficiency  Labs ordered   Prediabetes Lab Results  Component Value Date   HGBA1C 5.8 09/17/2019   Due for labs today  Diet is not optimal/ very busy  Has cut back on sweets  No soda Occ sweet tea or lemonade    Too much junk food  Does not eat often enough    Hyperlipidemia  Lab Results  Component Value Date   CHOL 233 (H) 03/05/2020   HDL 76.00 03/05/2020   LDLCALC 134 (H) 03/05/2020   TRIG 113.0 03/05/2020   CHOLHDL 3 03/05/2020   Due for labs  Has family h/o CAD  The 10-year ASCVD risk score (Arnett DK, et al., 2019) is: 13.7%   Values used to calculate the score:     Age: 86 years     Sex: Female     Is Non-Hispanic African American: Yes     Diabetic: No     Tobacco smoker: No     Systolic Blood Pressure: 644 mmHg     Is BP treated: Yes     HDL Cholesterol: 76 mg/dL     Total Cholesterol: 233 mg/dL   Migraines are stable with current medications  Topamax Imitrex   Patient Active Problem List   Diagnosis Date Noted   Encounter for screening mammogram for breast cancer 05/18/2021   Productive cough 05/18/2021   Routine general medical examination at a health care facility 05/18/2021   Medicare annual wellness visit, subsequent 03/11/2021   Hyperlipidemia 08/09/2020   Paresthesia of right leg 08/09/2020   BMI 35.0-35.9,adult 03/11/2019   Conductive hearing loss of left ear 02/12/2018   Vitamin D deficiency 02/07/2018   Fatigue 02/06/2018   Dizziness 02/06/2018   Migraine 02/06/2018   Essential hypertension 09/03/2017   Prediabetes 09/03/2017   Past Medical History:  Diagnosis Date   Allergy    seasonal allergies   Arthritis    Asthma    childhood/hx of/ no issues recently   GERD (gastroesophageal reflux disease)    PRN OTC   Hypertension    on meds   Migraine    Seasonal allergies    Past Surgical History:  Procedure Laterality Date   TOOTH EXTRACTION     wisdom teeth and additional teeth removed   TUBAL LIGATION      Social History   Tobacco Use   Smoking status: Former   Smokeless tobacco: Never  Substance Use Topics   Alcohol use: No   Drug use: No   Family History  Problem Relation Age of Onset   Asthma Mother  Hypertension Mother    Miscarriages / [place] Mother    Stroke Mother    Pneumonia Father    Heart disease Sister    Hypertension Sister    Stroke Sister    Other Brother        drowned   Kidney disease Daughter    Brain cancer Daughter    Early death Son    Kidney disease Son    Colon cancer Neg Hx    Colon polyps Neg Hx    Esophageal cancer Neg Hx    Stomach cancer Neg Hx    Rectal cancer Neg Hx    Allergies  Allergen Reactions   Penicillins Hives, Swelling and Rash    Facial swelling Has patient had a PCN reaction causing immediate rash, facial/tongue/throat swelling, SOB or lightheadedness with hypotension: Yes Has patient had a PCN reaction causing severe rash involving mucus membranes or skin necrosis: No Has patient had a PCN reaction that required hospitalization: No Has patient had a PCN reaction occurring within the last 10 years: No If all of the above answers are "NO", then may proceed with Cephalosporin use.   Current Outpatient Medications on File Prior to Visit  Medication Sig Dispense Refill   amLODipine (NORVASC) 5 MG tablet TAKE 1 TABLET (5 MG TOTAL) BY MOUTH DAILY. 90 tablet 0   aspirin EC 81 MG tablet Take 81 mg by mouth daily as needed (pain).     aspirin-acetaminophen-caffeine (EXCEDRIN MIGRAINE) 250-250-65 MG tablet Take 2 tablets by mouth every 8 (eight) hours as needed for headache or migraine.     Aspirin-Salicylamide-Caffeine (BC HEADACHE POWDER PO) Take 1-2 packets by mouth 2 (two) times daily as needed (migraine).     Cholecalciferol (VITAMIN D3) 1.25 MG (50000 UT) CAPS TAKE 1 CAPSULE BY MOUTH EVERY 7 (SEVEN) DAYS. 12 capsule 2   hydrochlorothiazide (HYDRODIURIL) 25 MG tablet TAKE 1 TABLET (25 MG TOTAL) BY MOUTH DAILY. 90 tablet 1    mineral oil liquid See admin instructions. Instill 4-5 drops in to left ear as needed for wax buildup     Multiple Vitamin (MULTIVITAMIN WITH MINERALS) TABS tablet Take 1 tablet by mouth daily.     peppermint oil liquid Apply topically as needed (headache/migraine).     SUMAtriptan (IMITREX) 50 MG tablet Take 1 tablet (50 mg total) by mouth every 2 (two) hours as needed for migraine. May repeat in 2 hours x 1 if needed. 10 tablet 0   topiramate (TOPAMAX) 50 MG tablet Take 1 tablet (50 mg total) by mouth 2 (two) times daily. 60 tablet 1   No current facility-administered medications on file prior to visit.    Review of Systems  Constitutional:  Negative for activity change, appetite change, fatigue, fever and unexpected weight change.  HENT:  Negative for congestion, ear pain, rhinorrhea, sinus pressure and sore throat.   Eyes:  Negative for pain, redness and visual disturbance.  Respiratory:  Positive for cough. Negative for shortness of breath and wheezing.   Cardiovascular:  Negative for chest pain and palpitations.  Gastrointestinal:  Negative for abdominal pain, blood in stool, constipation and diarrhea.  Endocrine: Negative for polydipsia and polyuria.  Genitourinary:  Negative for dysuria, frequency and urgency.  Musculoskeletal:  Negative for arthralgias, back pain and myalgias.       Shoulders are sore  Skin:  Negative for pallor and rash.  Allergic/Immunologic: Negative for environmental allergies.  Neurological:  Negative for dizziness, syncope and headaches.  Hematological:  Negative for adenopathy.  Does not bruise/bleed easily.  Psychiatric/Behavioral:  Negative for decreased concentration and dysphoric mood. The patient is not nervous/anxious.       Objective:   Physical Exam Constitutional:      General: She is not in acute distress.    Appearance: Normal appearance. She is well-developed. She is obese. She is not ill-appearing or diaphoretic.  HENT:     Head:  Normocephalic and atraumatic.     Right Ear: Tympanic membrane, ear canal and external ear normal.     Left Ear: Tympanic membrane, ear canal and external ear normal.     Nose: Nose normal. No congestion.     Mouth/Throat:     Mouth: Mucous membranes are moist.     Pharynx: Oropharynx is clear. No posterior oropharyngeal erythema.  Eyes:     General: No scleral icterus.    Extraocular Movements: Extraocular movements intact.     Conjunctiva/sclera: Conjunctivae normal.     Pupils: Pupils are equal, round, and reactive to light.  Neck:     Thyroid: No thyromegaly.     Vascular: No carotid bruit or JVD.  Cardiovascular:     Rate and Rhythm: Normal rate and regular rhythm.     Pulses: Normal pulses.     Heart sounds: Normal heart sounds.    No gallop.  Pulmonary:     Effort: Pulmonary effort is normal. No respiratory distress.     Breath sounds: Normal breath sounds. No wheezing.     Comments: Good air exch Chest:     Chest wall: No tenderness.  Abdominal:     General: Bowel sounds are normal. There is no distension or abdominal bruit.     Palpations: Abdomen is soft. There is no mass.     Tenderness: There is no abdominal tenderness.     Hernia: No hernia is present.  Genitourinary:    Comments: Breast exam: No mass, nodules, thickening, tenderness, bulging, retraction, inflamation, nipple discharge or skin changes noted.  No axillary or clavicular LA.   Dense/fibrous tissue notes    Musculoskeletal:        General: No tenderness. Normal range of motion.     Cervical back: Normal range of motion and neck supple. No rigidity. No muscular tenderness.     Right lower leg: No edema.     Left lower leg: No edema.  Lymphadenopathy:     Cervical: No cervical adenopathy.  Skin:    General: Skin is warm and dry.     Coloration: Skin is not pale.     Findings: No erythema or rash.     Comments: Few skin tags   Neurological:     Mental Status: She is alert. Mental status is at  baseline.     Cranial Nerves: No cranial nerve deficit.     Motor: No abnormal muscle tone.     Coordination: Coordination normal.     Gait: Gait normal.     Deep Tendon Reflexes: Reflexes are normal and symmetric. Reflexes normal.  Psychiatric:        Mood and Affect: Mood normal.        Cognition and Memory: Cognition and memory normal.     Comments: sharp          Assessment & Plan:   Problem List Items Addressed This Visit       Cardiovascular and Mediastinum   Essential hypertension    bp in fair control at this time  BP Readings from Last 1  Encounters:  05/18/21 136/86  No changes needed Most recent labs reviewed  Disc lifstyle change with low sodium diet and exercise  Plan to continue hctz 25 mg daily and amlodipine 5 mg daily  Labs ordered        Other   Prediabetes    a1c ordered  Diet is not optimal -discussed goals to cut sugar intake  disc imp of low glycemic diet and wt loss to prevent DM2       Vitamin D deficiency    Level today  Takes ergocalciferol high dose weekly  Will see if she needs to continue that   Discussed importance to bone and overall health      BMI 35.0-35.9,adult    Discussed how this problem influences overall health and the risks it imposes  Reviewed plan for weight loss with lower calorie diet (via better food choices and also portion control or program like weight watchers) and exercise building up to or more than 30 minutes 5 days per week including some aerobic activity         Hyperlipidemia    Disc goals for lipids and reasons to control them Rev last labs with pt Rev low sat fat diet in detail (needs to work on this) Labs done  Reviewed risks for vascular dz May need to consider statin        Medicare annual wellness visit, subsequent - Primary    Reviewed health habits including diet and exercise and skin cancer prevention Reviewed appropriate screening tests for age  Also reviewed health mt list, fam hx  and immunization status , as well as social and family history   See HPI Labs ordered Colonoscopy utd Mammogram ordered/pt will schedule  Flu vaccine given  Discussed shingrix vaccine, considering  Reviewed nl dexa 2019  Discussed fall prevention- may need PT in the future  Good vit D intake and level pending Materials given to work on advance directive No cognitive concerns        Relevant Orders   Flu Vaccine QUAD High Dose(Fluad) (Completed)   Encounter for screening mammogram for breast cancer    Mammogram ordered  Dense breast tissue with bx in the past  Pt will schedule this      Relevant Orders   MM 3D SCREEN BREAST BILATERAL   Productive cough    S/p flu over 2 weeks ago  Reassuring exam  High risk for pna  tx with zpak today  Discussed symptom control F/u if no improvement       Routine general medical examination at a health care facility    Reviewed health habits including diet and exercise and skin cancer prevention Reviewed appropriate screening tests for age  Also reviewed health mt list, fam hx and immunization status , as well as social and family history   See HPI Labs ordered Colonoscopy utd Mammogram ordered/pt will schedule  Flu vaccine given  Discussed shingrix vaccine, considering  Reviewed nl dexa 2019  Discussed fall prevention- may need PT in the future  Good vit D intake and level pending Materials given to work on advance directive No cognitive concerns       Other Visit Diagnoses     Need for influenza vaccination       Relevant Orders   Flu Vaccine QUAD High Dose(Fluad) (Completed)

## 2021-05-18 NOTE — Assessment & Plan Note (Signed)
Level today  Takes ergocalciferol high dose weekly  Will see if she needs to continue that   Discussed importance to bone and overall health

## 2021-05-18 NOTE — Assessment & Plan Note (Signed)
a1c ordered  Diet is not optimal -discussed goals to cut sugar intake  disc imp of low glycemic diet and wt loss to prevent DM2

## 2021-05-18 NOTE — Assessment & Plan Note (Signed)
Disc goals for lipids and reasons to control them Rev last labs with pt Rev low sat fat diet in detail (needs to work on this) Labs done  Reviewed risks for vascular dz May need to consider statin

## 2021-05-18 NOTE — Assessment & Plan Note (Signed)
Reviewed health habits including diet and exercise and skin cancer prevention Reviewed appropriate screening tests for age  Also reviewed health mt list, fam hx and immunization status , as well as social and family history   See HPI Labs ordered Colonoscopy utd Mammogram ordered/pt will schedule  Flu vaccine given  Discussed shingrix vaccine, considering  Reviewed nl dexa 2019  Discussed fall prevention- may need PT in the future  Good vit D intake and level pending Materials given to work on advance directive No cognitive concerns

## 2021-05-18 NOTE — Patient Instructions (Addendum)
If you are interested in the new shingles vaccine (Shingrix) - call your local pharmacy to check on coverage and availability  If affordable, get on a wait list at your pharmacy to get the vaccine.  For diet  Try to get most of your carbohydrates from produce (with the exception of white potatoes)  Eat less bread/pasta/rice/snack foods/cereals/sweets and other items from the middle of the grocery store (processed carbs)  Take zpak for your productive cough  If wheezing worsens let us know    Flu shot today  Labs today   To schedule your mammogram  Please call the location of your choice from the menu below to schedule your Mammogram and/or Bone Density appointment.    [facility]                      Phone:  [phone], [place] [address]                                                             Services: Traditional and 3D Mammogram, [facility]                 Phone: [phone] [address], [place] [address]    Service: Bone Density ONLY   *this site does NOT perform mammograms  [facility]                        Phone:  [phone] [address], [place] [address]                                            Services:  3D Mammogram and [place] at [facility]   Phone:  [phone]   [address], [place] [address]  Services: 3D Mammogram and Bone Density  [place] at [facility])  Phone:  [phone]   [address] [place], [place] [address]                                              Services:  3D Mammogram and Bone Density

## 2021-05-18 NOTE — Assessment & Plan Note (Signed)
Mammogram ordered  Dense breast tissue with bx in the past  Pt will schedule this

## 2021-05-19 ENCOUNTER — Encounter: Payer: Self-pay | Admitting: *Deleted

## 2021-06-13 ENCOUNTER — Other Ambulatory Visit: Payer: Self-pay | Admitting: Family Medicine

## 2021-06-13 DIAGNOSIS — I10 Essential (primary) hypertension: Secondary | ICD-10-CM

## 2021-06-17 ENCOUNTER — Ambulatory Visit
Admission: RE | Admit: 2021-06-17 | Discharge: 2021-06-17 | Disposition: A | Discharge status: Home / Self Care | Payer: Medicare HMO | Source: Ambulatory Visit | Attending: Family Medicine | Admitting: Family Medicine

## 2021-06-17 DIAGNOSIS — Z1231 Encounter for screening mammogram for malignant neoplasm of breast: Secondary | ICD-10-CM

## 2021-07-10 ENCOUNTER — Other Ambulatory Visit: Payer: Self-pay | Admitting: Family Medicine

## 2021-07-10 DIAGNOSIS — I10 Essential (primary) hypertension: Secondary | ICD-10-CM

## 2021-07-13 ENCOUNTER — Ambulatory Visit (INDEPENDENT_AMBULATORY_CARE_PROVIDER_SITE_OTHER)
Admission: RE | Admit: 2021-07-13 | Discharge: 2021-07-13 | Disposition: A | Discharge status: Home / Self Care | Payer: Medicare HMO | Source: Ambulatory Visit | Attending: Family Medicine | Admitting: Family Medicine

## 2021-07-13 ENCOUNTER — Encounter: Payer: Self-pay | Admitting: Family Medicine

## 2021-07-13 ENCOUNTER — Other Ambulatory Visit: Payer: Self-pay

## 2021-07-13 ENCOUNTER — Ambulatory Visit (INDEPENDENT_AMBULATORY_CARE_PROVIDER_SITE_OTHER): Payer: Medicare HMO | Admitting: Family Medicine

## 2021-07-13 VITALS — BP 150/90 | HR 64 | Temp 97.6°F | Ht 60.5 in | Wt 190.2 lb

## 2021-07-13 DIAGNOSIS — G8929 Other chronic pain: Secondary | ICD-10-CM

## 2021-07-13 DIAGNOSIS — M545 Low back pain, unspecified: Secondary | ICD-10-CM | POA: Diagnosis not present

## 2021-07-13 DIAGNOSIS — M25562 Pain in left knee: Secondary | ICD-10-CM | POA: Diagnosis not present

## 2021-07-13 DIAGNOSIS — R202 Paresthesia of skin: Secondary | ICD-10-CM | POA: Diagnosis not present

## 2021-07-13 DIAGNOSIS — M5441 Lumbago with sciatica, right side: Secondary | ICD-10-CM | POA: Diagnosis not present

## 2021-07-13 DIAGNOSIS — M4316 Spondylolisthesis, lumbar region: Secondary | ICD-10-CM | POA: Diagnosis not present

## 2021-07-13 DIAGNOSIS — M1712 Unilateral primary osteoarthritis, left knee: Secondary | ICD-10-CM | POA: Diagnosis not present

## 2021-07-13 NOTE — Assessment & Plan Note (Signed)
Reviewed neuro notes  Suspect radiculopathy  (r sided back pain) She never had planned mri   Will check xray today

## 2021-07-13 NOTE — Assessment & Plan Note (Signed)
Acute on chronic  Radicular symptoms- RLE Pain with extension  Some SI tenderness  Xray pending  Will likely need MRI as prev planned by neuro Given rehab handout for sciatica

## 2021-07-13 NOTE — Patient Instructions (Signed)
Tylenol is ok - but look at the label and follow directions   Tylenol arthritis is every 8 hours Regular tylenol is every 4-6 hours   Try some ice on your knee for 10 minutes at a time  Try some heat on low back and buttock area for 10 minutes at a time    Xrays today- back and knee  We may consider MRI of your low back also   You can try voltaren gel 1% over the counter as needed/as directed  We may consider sport medicine or orthopedic opinion for back and knee and later the shoulder

## 2021-07-13 NOTE — Progress Notes (Signed)
Subjective:    Patient ID: Joann Drake, female    DOB: 1951-02-17, 71 y.o.   MRN: [id number]  This visit occurred during the SARS-CoV-2 public health emergency.  Safety protocols were in place, including screening questions prior to the visit, additional usage of staff PPE, and extensive cleaning of exam room while observing appropriate contact time as indicated for disinfecting solutions.   HPI Pt presents with c/o knee pain and also some hip pain   Wt Readings from Last 3 Encounters:  07/13/21 190 lb 4 oz (86.3 kg)  05/18/21 187 lb 6 oz (85 kg)  09/14/20 188 lb (85.3 kg)   36.54 kg/m  Symptoms started about a month ago   L knee -front over the knee cap  Radiates down to calf and up to the lateral thigh  Some swelling /puffy Not tight  Sharp when walking / dull otherwise  Cannot get comfortable in bed  Positions with pillows   Some stiffness Stairs bother her a lot  Using a cane for this      Hip pain is ongoing- worse in a month (right)  Not in the groin  Is in the buttock area  Sharp pain and numb feeling  Pain travels down her leg (shoots)  Hits her any time - can be from walking or standing too long  No comfortable position  Does not bother her in bed   Still having some paresthesia in right leg MRI was ordered from neuro but she says they never called to schedule it  Leg feels limp/not strong   Taking tylenol  Using a patch otc - helps  Has not used heat or ice yet   Had a fall  Has some pain deep in R shoulder also  Patient Active Problem List   Diagnosis Date Noted   Right low back pain 07/13/2021   Left knee pain 07/13/2021   Encounter for screening mammogram for breast cancer 05/18/2021   Productive cough 05/18/2021   Routine general medical examination at a health care facility 05/18/2021   Medicare annual wellness visit, subsequent 03/11/2021   Hyperlipidemia 08/09/2020   Paresthesia of right leg 08/09/2020   BMI  35.0-35.9,adult 03/11/2019   Conductive hearing loss of left ear 02/12/2018   Vitamin D deficiency 02/07/2018   Fatigue 02/06/2018   Dizziness 02/06/2018   Migraine 02/06/2018   Essential hypertension 09/03/2017   Prediabetes 09/03/2017   Past Medical History:  Diagnosis Date   Allergy    seasonal allergies   Arthritis    Asthma    childhood/hx of/ no issues recently   GERD (gastroesophageal reflux disease)    PRN OTC   Hypertension    on meds   Migraine    Seasonal allergies    Past Surgical History:  Procedure Laterality Date   TOOTH EXTRACTION     wisdom teeth and additional teeth removed   TUBAL LIGATION     Social History   Tobacco Use   Smoking status: Former   Smokeless tobacco: Never  Substance Use Topics   Alcohol use: No   Drug use: No   Family History  Problem Relation Age of Onset   Asthma Mother    Hypertension Mother    Miscarriages / [place] Mother    Stroke Mother    Pneumonia Father    Heart disease Sister    Hypertension Sister    Stroke Sister    Other Brother        drowned  Kidney disease Daughter    Brain cancer Daughter    Early death Son    Kidney disease Son    Colon cancer Neg Hx    Colon polyps Neg Hx    Esophageal cancer Neg Hx    Stomach cancer Neg Hx    Rectal cancer Neg Hx    Allergies  Allergen Reactions   Penicillins Hives, Swelling and Rash    Facial swelling Has patient had a PCN reaction causing immediate rash, facial/tongue/throat swelling, SOB or lightheadedness with hypotension: Yes Has patient had a PCN reaction causing severe rash involving mucus membranes or skin necrosis: No Has patient had a PCN reaction that required hospitalization: No Has patient had a PCN reaction occurring within the last 10 years: No If all of the above answers are "NO", then may proceed with Cephalosporin use.   Current Outpatient Medications on File Prior to Visit  Medication Sig Dispense Refill   albuterol (VENTOLIN HFA)  108 (90 Base) MCG/ACT inhaler Inhale 2 puffs into the lungs every 6 (six) hours as needed for wheezing or shortness of breath. 1 each 5   amLODipine (NORVASC) 5 MG tablet TAKE 1 TABLET (5 MG TOTAL) BY MOUTH DAILY. 90 tablet 0   aspirin EC 81 MG tablet Take 81 mg by mouth daily as needed (pain).     aspirin-acetaminophen-caffeine (EXCEDRIN MIGRAINE) 250-250-65 MG tablet Take 2 tablets by mouth every 8 (eight) hours as needed for headache or migraine.     Aspirin-Salicylamide-Caffeine (BC HEADACHE POWDER PO) Take 1-2 packets by mouth 2 (two) times daily as needed (migraine).     Cholecalciferol (VITAMIN D3) 1.25 MG (50000 UT) CAPS TAKE 1 CAPSULE BY MOUTH EVERY 7 (SEVEN) DAYS. 12 capsule 2   hydrochlorothiazide (HYDRODIURIL) 25 MG tablet TAKE 1 TABLET (25 MG TOTAL) BY MOUTH DAILY. 90 tablet 1   mineral oil liquid See admin instructions. Instill 4-5 drops in to left ear as needed for wax buildup     Multiple Vitamin (MULTIVITAMIN WITH MINERALS) TABS tablet Take 1 tablet by mouth daily.     peppermint oil liquid Apply topically as needed (headache/migraine).     SUMAtriptan (IMITREX) 50 MG tablet Take 1 tablet (50 mg total) by mouth every 2 (two) hours as needed for migraine. May repeat in 2 hours x 1 if needed. 10 tablet 0   topiramate (TOPAMAX) 50 MG tablet Take 1 tablet (50 mg total) by mouth 2 (two) times daily. 60 tablet 1   No current facility-administered medications on file prior to visit.     Review of Systems  Constitutional:  Negative for activity change, appetite change, fatigue, fever and unexpected weight change.  HENT:  Negative for congestion, ear pain, rhinorrhea, sinus pressure and sore throat.   Eyes:  Negative for pain, redness and visual disturbance.  Respiratory:  Negative for cough, shortness of breath and wheezing.   Cardiovascular:  Negative for chest pain and palpitations.  Gastrointestinal:  Negative for abdominal pain, blood in stool, constipation and diarrhea.   Endocrine: Negative for polydipsia and polyuria.  Genitourinary:  Negative for dysuria, frequency and urgency.  Musculoskeletal:  Positive for arthralgias. Negative for back pain and myalgias.       L knee pain  R buttock pain  R shoulder pain  No swelling  Skin:  Negative for pallor and rash.  Allergic/Immunologic: Negative for environmental allergies.  Neurological:  Negative for dizziness, syncope and headaches.  Hematological:  Negative for adenopathy. Does not bruise/bleed easily.  Psychiatric/Behavioral:  Negative for decreased concentration and dysphoric mood. The patient is not nervous/anxious.       Objective:   Physical Exam Constitutional:      General: She is not in acute distress.    Appearance: Normal appearance. She is obese. She is not ill-appearing.  Eyes:     Conjunctiva/sclera: Conjunctivae normal.     Pupils: Pupils are equal, round, and reactive to light.  Cardiovascular:     Rate and Rhythm: Normal rate and regular rhythm.  Musculoskeletal:     Right shoulder: Tenderness present. Decreased range of motion.     Lumbar back: Tenderness present. No swelling, deformity, spasms or bony tenderness. Normal range of motion. Negative right straight leg raise test. No scoliosis.     Right hip: Normal. Normal range of motion.     Left hip: Normal. Normal range of motion.     Comments: LS- no scoliosis  Nl flexion, pain on extension on the R Some R SI tenderness Pain with piriformis stretch (rom of hips is normal)  No neuro changes   L knee  No swelling or effusion  No warmth to the touch  Slt crepitus  ROM: pain with full flexion  Ext -nl Mcmurray-mild discomfort Bounce test -nl  Stability: Anterior drawer-nl Lachman exam -nl, good end pt  Tenderness mild in patellofemoral area and medial joint line  Gait nl  Limited abd of R shoulder     Neurological:     Mental Status: She is alert.          Assessment & Plan:   Problem List Items  Addressed This Visit       Other   Left knee pain    Suspect poss OA with medial joint pain  Using a cane to walk  Exam is reassuring  Adv ice/elevate/ compression if helpful Will try voltaren gel Tylenol prn Avoid high impact activity Xray today      Relevant Orders   DG Knee 4 Views W/Patella Left   Paresthesia of right leg    Reviewed neuro notes  Suspect radiculopathy  (r sided back pain) She never had planned mri   Will check xray today      Relevant Orders   DG Lumbar Spine Complete   Right low back pain - Primary    Acute on chronic  Radicular symptoms- RLE Pain with extension  Some SI tenderness  Xray pending  Will likely need MRI as prev planned by neuro Given rehab handout for sciatica        Relevant Orders   DG Lumbar Spine Complete

## 2021-07-13 NOTE — Assessment & Plan Note (Signed)
Suspect poss OA with medial joint pain  Using a cane to walk  Exam is reassuring  Adv ice/elevate/ compression if helpful Will try voltaren gel Tylenol prn Avoid high impact activity Xray today

## 2021-07-15 ENCOUNTER — Telehealth: Payer: Self-pay | Admitting: *Deleted

## 2021-07-15 DIAGNOSIS — G8929 Other chronic pain: Secondary | ICD-10-CM

## 2021-07-15 DIAGNOSIS — R202 Paresthesia of skin: Secondary | ICD-10-CM

## 2021-07-15 DIAGNOSIS — M5441 Lumbago with sciatica, right side: Secondary | ICD-10-CM

## 2021-07-15 NOTE — Telephone Encounter (Signed)
Called pt and no answer and pt's VM box is full, no message left

## 2021-07-15 NOTE — Telephone Encounter (Signed)
-----   Message from Abner Greenspan, MD sent at 07/14/2021  5:49 PM EST ----- There is degenerative change in spine as expected I would like to order an mri as discussed  Do you prefer an area for that test ? [facility] or [facility] [employer]?)

## 2021-07-26 ENCOUNTER — Encounter: Payer: Self-pay | Admitting: *Deleted

## 2021-08-01 ENCOUNTER — Telehealth: Payer: Self-pay

## 2021-08-01 NOTE — Telephone Encounter (Signed)
Patient states she is returning a call. Patient believes she is suppose to have a MRI set up. Please advise

## 2021-08-02 NOTE — Telephone Encounter (Signed)
I placed the referral  ?Unsure how long it will take  ?Have her call if she does not hear in 1-2 weeks  ? ?Thanks  ?Specified [facility] but whoever has availability in [place] is fine  ? ?thanks ? ? ?

## 2021-08-02 NOTE — Telephone Encounter (Signed)
Pt wants to do MRI of her spine. Would like [facility]. ?

## 2021-08-02 NOTE — Telephone Encounter (Signed)
See other TE.

## 2021-08-03 ENCOUNTER — Encounter: Payer: Self-pay | Admitting: *Deleted

## 2021-08-05 NOTE — Telephone Encounter (Signed)
Tried to call pt. VM is full ?

## 2021-08-05 NOTE — Telephone Encounter (Signed)
We have tried to call the pt several times. I have sent her a MyChart message with Joann Drake's note. ?

## 2021-08-05 NOTE — Telephone Encounter (Signed)
MRI is approved by insurance.  ?Ready to be scheduled. I have been unable to reach the patient to give her the information to call and scheduled her MRI. [facility] likes to schedule these themselves with the patients due to screening questions that must be asked prior to scheduling.  ?

## 2021-08-09 NOTE — Telephone Encounter (Signed)
Pt is scheduled 08/21/21 for MRI ? ?FYI ? ?Nothing further needed.  ? ?

## 2021-08-21 ENCOUNTER — Ambulatory Visit
Admission: RE | Admit: 2021-08-21 | Discharge: 2021-08-21 | Disposition: A | Discharge status: Home / Self Care | Payer: Medicare HMO | Source: Ambulatory Visit | Attending: Family Medicine | Admitting: Family Medicine

## 2021-08-21 ENCOUNTER — Other Ambulatory Visit: Payer: Self-pay

## 2021-08-21 DIAGNOSIS — M47816 Spondylosis without myelopathy or radiculopathy, lumbar region: Secondary | ICD-10-CM | POA: Diagnosis not present

## 2021-08-21 DIAGNOSIS — G8929 Other chronic pain: Secondary | ICD-10-CM

## 2021-08-21 DIAGNOSIS — M5416 Radiculopathy, lumbar region: Secondary | ICD-10-CM | POA: Diagnosis not present

## 2021-08-21 DIAGNOSIS — M48061 Spinal stenosis, lumbar region without neurogenic claudication: Secondary | ICD-10-CM | POA: Diagnosis not present

## 2021-08-21 DIAGNOSIS — R200 Anesthesia of skin: Secondary | ICD-10-CM | POA: Diagnosis not present

## 2021-08-21 DIAGNOSIS — M5136 Other intervertebral disc degeneration, lumbar region: Secondary | ICD-10-CM | POA: Diagnosis not present

## 2021-08-21 DIAGNOSIS — M4316 Spondylolisthesis, lumbar region: Secondary | ICD-10-CM | POA: Diagnosis not present

## 2021-08-21 DIAGNOSIS — M545 Low back pain, unspecified: Secondary | ICD-10-CM | POA: Diagnosis not present

## 2021-08-21 DIAGNOSIS — R202 Paresthesia of skin: Secondary | ICD-10-CM

## 2021-08-22 DIAGNOSIS — H04123 Dry eye syndrome of bilateral lacrimal glands: Secondary | ICD-10-CM | POA: Diagnosis not present

## 2021-08-22 DIAGNOSIS — H524 Presbyopia: Secondary | ICD-10-CM | POA: Diagnosis not present

## 2021-08-25 ENCOUNTER — Telehealth: Payer: Self-pay | Admitting: *Deleted

## 2021-08-25 NOTE — Telephone Encounter (Signed)
-----   Message from Abner Greenspan, MD sent at 08/23/2021  8:11 PM EDT ----- ?Your MRI shows some degenerative disc changes as expected - causing nerve impingement ?Also some anatomical positioning that could also result in symptoms  ?I will send a copy to your neurologist  ? ?How are your symptoms ? ?

## 2021-08-25 NOTE — Telephone Encounter (Signed)
Called pt and no answer and pt's mailbox was full (no message left) ?

## 2021-08-29 ENCOUNTER — Telehealth: Payer: Self-pay

## 2021-08-29 NOTE — Telephone Encounter (Signed)
Tried to call pt vm was full not able to left vm regarding MRI results  ?

## 2021-08-29 NOTE — Telephone Encounter (Signed)
-----   Message from Abner Greenspan, MD sent at 08/23/2021  8:11 PM EDT ----- ?Your MRI shows some degenerative disc changes as expected - causing nerve impingement ?Also some anatomical positioning that could also result in symptoms  ?I will send a copy to your neurologist  ? ?How are your symptoms ? ?

## 2021-09-05 ENCOUNTER — Encounter: Payer: Self-pay | Admitting: *Deleted

## 2021-11-05 ENCOUNTER — Other Ambulatory Visit: Payer: Self-pay | Admitting: Family Medicine

## 2021-11-05 DIAGNOSIS — I10 Essential (primary) hypertension: Secondary | ICD-10-CM

## 2021-11-14 DIAGNOSIS — M13862 Other specified arthritis, left knee: Secondary | ICD-10-CM | POA: Diagnosis not present

## 2021-12-18 ENCOUNTER — Other Ambulatory Visit: Payer: Self-pay | Admitting: Family Medicine

## 2021-12-18 DIAGNOSIS — I10 Essential (primary) hypertension: Secondary | ICD-10-CM

## 2021-12-26 ENCOUNTER — Other Ambulatory Visit: Payer: Self-pay | Admitting: Diagnostic Neuroimaging

## 2021-12-30 ENCOUNTER — Other Ambulatory Visit: Payer: Self-pay

## 2022-04-14 DIAGNOSIS — E785 Hyperlipidemia, unspecified: Secondary | ICD-10-CM | POA: Diagnosis not present

## 2022-04-14 DIAGNOSIS — Z1231 Encounter for screening mammogram for malignant neoplasm of breast: Secondary | ICD-10-CM | POA: Diagnosis not present

## 2022-04-14 DIAGNOSIS — R7303 Prediabetes: Secondary | ICD-10-CM | POA: Diagnosis not present

## 2022-04-14 DIAGNOSIS — R200 Anesthesia of skin: Secondary | ICD-10-CM | POA: Diagnosis not present

## 2022-04-14 DIAGNOSIS — G43909 Migraine, unspecified, not intractable, without status migrainosus: Secondary | ICD-10-CM | POA: Diagnosis not present

## 2022-04-14 DIAGNOSIS — E559 Vitamin D deficiency, unspecified: Secondary | ICD-10-CM | POA: Diagnosis not present

## 2022-04-14 DIAGNOSIS — I10 Essential (primary) hypertension: Secondary | ICD-10-CM | POA: Diagnosis not present

## 2022-04-14 DIAGNOSIS — M5136 Other intervertebral disc degeneration, lumbar region: Secondary | ICD-10-CM | POA: Diagnosis not present

## 2022-04-27 DIAGNOSIS — M19072 Primary osteoarthritis, left ankle and foot: Secondary | ICD-10-CM | POA: Diagnosis not present

## 2022-04-27 DIAGNOSIS — M79672 Pain in left foot: Secondary | ICD-10-CM | POA: Diagnosis not present

## 2022-04-27 DIAGNOSIS — G43909 Migraine, unspecified, not intractable, without status migrainosus: Secondary | ICD-10-CM | POA: Diagnosis not present

## 2022-04-27 DIAGNOSIS — G8929 Other chronic pain: Secondary | ICD-10-CM | POA: Diagnosis not present

## 2022-04-27 DIAGNOSIS — M25562 Pain in left knee: Secondary | ICD-10-CM | POA: Diagnosis not present

## 2022-05-04 ENCOUNTER — Telehealth: Payer: Self-pay | Admitting: Family Medicine

## 2022-05-04 NOTE — Telephone Encounter (Signed)
LVM for pt to rtn my call to schedule AWV with [facility] call back # [phone] 

## 2022-05-12 ENCOUNTER — Ambulatory Visit
Admission: EM | Admit: 2022-05-12 | Discharge: 2022-05-12 | Disposition: A | Discharge status: Home / Self Care | Payer: Medicare HMO | Attending: Emergency Medicine | Admitting: Emergency Medicine

## 2022-05-12 ENCOUNTER — Other Ambulatory Visit: Payer: Self-pay | Admitting: Family Medicine

## 2022-05-12 ENCOUNTER — Telehealth: Payer: Self-pay

## 2022-05-12 DIAGNOSIS — Z1152 Encounter for screening for COVID-19: Secondary | ICD-10-CM | POA: Diagnosis not present

## 2022-05-12 DIAGNOSIS — I10 Essential (primary) hypertension: Secondary | ICD-10-CM

## 2022-05-12 DIAGNOSIS — B349 Viral infection, unspecified: Secondary | ICD-10-CM | POA: Insufficient documentation

## 2022-05-12 DIAGNOSIS — R059 Cough, unspecified: Secondary | ICD-10-CM | POA: Diagnosis present

## 2022-05-12 DIAGNOSIS — J4521 Mild intermittent asthma with (acute) exacerbation: Secondary | ICD-10-CM | POA: Diagnosis not present

## 2022-05-12 DIAGNOSIS — R051 Acute cough: Secondary | ICD-10-CM | POA: Diagnosis not present

## 2022-05-12 LAB — RESP PANEL BY RT-PCR (FLU A&B, COVID) ARPGX2
Influenza A by PCR: NEGATIVE
Influenza B by PCR: NEGATIVE
SARS Coronavirus 2 by RT PCR: NEGATIVE

## 2022-05-12 LAB — POCT RAPID STREP A (OFFICE): Rapid Strep A Screen: NEGATIVE

## 2022-05-12 MED ORDER — PREDNISONE 10 MG PO TABS: 40.0000 mg | ORAL_TABLET | Freq: Every day | ORAL | 0 refills | Status: AC

## 2022-05-12 NOTE — ED Triage Notes (Signed)
Patient to Urgent Care with complaints of productive cough, left sided ear fullness, headache, sore throat and shortness of breath w/ exertion x1 week.   Reports feeling as though she's had a fever but has not ran one.

## 2022-05-12 NOTE — Discharge Instructions (Addendum)
Take the prednisone as directed.  Use your albuterol inhaler as directed.    Your strep test is negative.  Your COVID and Flu tests are pending.    Take Tylenol or ibuprofen as needed for fever or discomfort.  Rest and keep yourself hydrated.    Follow-up with your primary care provider if your symptoms are not improving.

## 2022-05-12 NOTE — Telephone Encounter (Signed)
LVM for patient to schedule.

## 2022-05-12 NOTE — Telephone Encounter (Signed)
Aware, will watch for correspondence  

## 2022-05-12 NOTE — Telephone Encounter (Signed)
Last CPE was 05/18/21, no future appts., please advise

## 2022-05-12 NOTE — Telephone Encounter (Signed)
Per chart review pt is at [facility] in [place]. Sending note to Dr Glori Bickers and [employer].

## 2022-05-12 NOTE — Telephone Encounter (Signed)
[facility] - Client TELEPHONE ADVICE RECORD [facility] Patient Name: Joann Drake Gender: Female DOB: 1951/05/20 Age: 71 Y 63 M 29 D Return Phone Number: [phone] (Primary) Address[place]  [address] Client  [facility] - Client Client Site [facility] Provider Glori Bickers, Roque Lias - MD Contact Type Call Who Is Calling Patient / Member / Family / Caregiver Call Type Triage / Clinical Relationship To Patient Self Return Phone Number [phone] (Primary) Chief Complaint Sore Throat Reason for Call Request to Schedule Office Appointment Initial Comment Caller wanted to make an appt. Sx sore throat, earache Translation No Nurse Assessment Nurse: Hassell Done, RN, Joelene Millin Date/Time (Eastern Time): 05/12/2022 8:08:10 AM Confirm and document reason for call. If symptomatic, describe symptoms. ---caller states she has a sore throat and an earache. no fever. Does the patient have any new or worsening symptoms? ---Yes Will a triage be completed? ---Yes Related visit to physician within the last 2 weeks? ---No Does the PT have any chronic conditions? (i.e. diabetes, asthma, this includes High risk factors for pregnancy, etc.) ---Yes List chronic conditions. ---migraines, arthritis Is this a behavioral health or substance abuse call? ---No Guidelines Guideline Title Affirmed Question Affirmed Notes Nurse Date/Time Eilene Ghazi Time) Sore Throat [1] Difficulty breathing AND [2] not severe Alanda Amass 05/12/2022 8:09:12 AM Disp. Time Eilene Ghazi Time) Disposition Final User 05/12/2022 8:11:12 AM Go to ED Now (or PCP triage) Yes Hassell Done, RN, Joelene Millin Final Disposition 05/12/2022 8:11:12 AM Go to ED Now (or PCP triage) Yes Hassell Done, RN, Joelene Millin PLEASE NOTE: All timestamps contained within this report are represented as [place] Standard Time. CONFIDENTIALTY  NOTICE: This fax transmission is intended only for the addressee. It contains information that is legally privileged, confidential or otherwise protected from use or disclosure. If you are not the intended recipient, you are strictly prohibited from reviewing, disclosing, copying using or disseminating any of this information or taking any action in reliance on or regarding this information. If you have received this fax in error, please notify us immediately by telephone so that we can arrange for its return to [place]. Phone: [phone], Toll-Free: [phone], Fax: [phone] Page: 2 of 2 Call Id: [id number] [place] Disagree/Comply Comply Caller Understands Yes PreDisposition Call Doctor Care Advice Given Per Guideline GO TO ED NOW (OR PCP TRIAGE): * IF NO PCP (PRIMARY CARE PROVIDER) SECOND-LEVEL TRIAGE: You need to be seen within the next hour. Go to the [facility] at _____________ [place] as soon as you can. CARE ADVICE given per Sore Throat (Adult) guideline. Referrals REFERRED TO PCP OFFIC

## 2022-05-12 NOTE — Telephone Encounter (Signed)
Refilled once  Please schedule annual exam this winter

## 2022-05-12 NOTE — ED Provider Notes (Signed)
[name]    CSN: [id number] Arrival date & time: 05/12/22  0854      History   Chief Complaint Chief Complaint  Patient presents with   Cough    HPI Joann Drake is a 71 y.o. female.  Accompanied by her daughter, patient presents with 5-day history of chills, headache, ear pain, sore throat, cough, wheezing, shortness of breath.  No OTC medications taken today.  She has been using her albuterol inhaler occasionally but none today.  She denies chest pain, vomiting, diarrhea, or other symptoms.  Patient requests strep, COVID, flu tests.  Her medical history includes asthma, seasonal allergies, hypertension, prediabetes, obesity, migraine headaches, GERD.  The history is provided by the patient, a relative and medical records.    Past Medical History:  Diagnosis Date   Allergy    seasonal allergies   Arthritis    Asthma    childhood/hx of/ no issues recently   GERD (gastroesophageal reflux disease)    PRN OTC   Hypertension    on meds   Migraine    Seasonal allergies     Patient Active Problem List   Diagnosis Date Noted   Right low back pain 07/13/2021   Left knee pain 07/13/2021   Encounter for screening mammogram for breast cancer 05/18/2021   Cough 05/18/2021   Routine general medical examination at a health care facility 05/18/2021   Medicare annual wellness visit, subsequent 03/11/2021   Hyperlipidemia 08/09/2020   Paresthesia of right leg 08/09/2020   BMI 35.0-35.9,adult 03/11/2019   Conductive hearing loss of left ear 02/12/2018   Vitamin D deficiency 02/07/2018   Fatigue 02/06/2018   Dizziness 02/06/2018   Migraine 02/06/2018   Essential hypertension 09/03/2017   Prediabetes 09/03/2017    Past Surgical History:  Procedure Laterality Date   TOOTH EXTRACTION     wisdom teeth and additional teeth removed   TUBAL LIGATION      OB History   No obstetric history on file.      Home Medications    Prior to Admission  medications   Medication Sig Start Date End Date Taking? Authorizing Provider  predniSONE (DELTASONE) 10 MG tablet Take 4 tablets (40 mg total) by mouth daily for 5 days. 05/12/22 05/17/22 Yes Sharion Balloon, NP  albuterol (VENTOLIN HFA) 108 (90 Base) MCG/ACT inhaler Inhale 2 puffs into the lungs every 6 (six) hours as needed for wheezing or shortness of breath. 05/18/21 06/28/24  Tower, Wynelle Fanny, MD  amLODipine (NORVASC) 5 MG tablet TAKE 1 TABLET (5 MG TOTAL) BY MOUTH DAILY. 11/07/21   Tower, Wynelle Fanny, MD  aspirin EC 81 MG tablet Take 81 mg by mouth daily as needed (pain).    [provider]  aspirin-acetaminophen-caffeine (EXCEDRIN MIGRAINE) [phone] MG tablet Take 2 tablets by mouth every 8 (eight) hours as needed for headache or migraine.    [provider]  Aspirin-Salicylamide-Caffeine (BC HEADACHE POWDER PO) Take 1-2 packets by mouth 2 (two) times daily as needed (migraine).    [provider]  Cholecalciferol (VITAMIN D3) 1.25 MG (50000 UT) CAPS TAKE 1 CAPSULE BY MOUTH EVERY 7 (SEVEN) DAYS. 08/09/20   Tower, Wynelle Fanny, MD  hydrochlorothiazide (HYDRODIURIL) 25 MG tablet TAKE 1 TABLET (25 MG TOTAL) BY MOUTH DAILY. 12/19/21   Tower, Wynelle Fanny, MD  mineral oil liquid See admin instructions. Instill 4-5 drops in to left ear as needed for wax buildup    [provider]  Multiple Vitamin (MULTIVITAMIN WITH  MINERALS) TABS tablet Take 1 tablet by mouth daily.    [provider]  peppermint oil liquid Apply topically as needed (headache/migraine).    [provider]  SUMAtriptan (IMITREX) 50 MG tablet Take 1 tablet (50 mg total) by mouth every 2 (two) hours as needed for migraine. May repeat in 2 hours x 1 if needed. 02/25/18   Elby Beck, FNP  topiramate (TOPAMAX) 50 MG tablet Take 1 tablet (50 mg total) by mouth 2 (two) times daily. 02/25/18   Elby Beck, FNP    Family History Family History  Problem Relation Age of Onset   Asthma Mother     Hypertension Mother    Miscarriages / [place] Mother    Stroke Mother    Pneumonia Father    Heart disease Sister    Hypertension Sister    Stroke Sister    Other Brother        drowned   Kidney disease Daughter    Brain cancer Daughter    Early death Son    Kidney disease Son    Colon cancer Neg Hx    Colon polyps Neg Hx    Esophageal cancer Neg Hx    Stomach cancer Neg Hx    Rectal cancer Neg Hx     Social History Social History   Tobacco Use   Smoking status: Former   Smokeless tobacco: Never  Substance Use Topics   Alcohol use: No   Drug use: No     Allergies   Penicillins   Review of Systems Review of Systems  Constitutional:  Positive for chills. Negative for fever.  HENT:  Positive for ear pain and sore throat.   Respiratory:  Positive for cough, shortness of breath and wheezing.   Cardiovascular:  Negative for chest pain and palpitations.  Gastrointestinal:  Negative for abdominal pain, diarrhea and vomiting.  Skin:  Negative for color change and rash.  All other systems reviewed and are negative.    Physical Exam Triage Vital Signs ED Triage Vitals  Enc Vitals Group     BP      Pulse      Resp      Temp      Temp src      SpO2      Weight      Height      Head Circumference      Peak Flow      Pain Score      Pain Loc      Pain Edu?      Excl. in [place]?    No data found.  Updated Vital Signs BP 136/86   Pulse 95   Temp 98.8 F (37.1 C)   Resp 18   Ht '5\' 2"'$  (1.575 m)   Wt 185 lb (83.9 kg)   SpO2 97%   BMI 33.84 kg/m   Visual Acuity Right Eye Distance:   Left Eye Distance:   Bilateral Distance:    Right Eye Near:   Left Eye Near:    Bilateral Near:     Physical Exam Vitals and nursing note reviewed.  Constitutional:      General: She is not in acute distress.    Appearance: She is well-developed. She is obese. She is not ill-appearing.  HENT:     Right Ear: Tympanic membrane normal.     Left Ear: Tympanic  membrane normal.     Nose: Nose normal.     Mouth/Throat:  Mouth: Mucous membranes are moist.     Pharynx: Oropharynx is clear.  Cardiovascular:     Rate and Rhythm: Normal rate and regular rhythm.     Heart sounds: Normal heart sounds.  Pulmonary:     Effort: Pulmonary effort is normal. No respiratory distress.     Breath sounds: Normal breath sounds. No wheezing, rhonchi or rales.  Musculoskeletal:     Cervical back: Neck supple.  Skin:    General: Skin is warm and dry.  Neurological:     Mental Status: She is alert.  Psychiatric:        Mood and Affect: Mood normal.        Behavior: Behavior normal.      UC Treatments / Results  Labs (all labs ordered are listed, but only abnormal results are displayed) Labs Reviewed  RESP PANEL BY RT-PCR (FLU A&B, COVID) ARPGX2  POCT RAPID STREP A (OFFICE)    EKG   Radiology No results found.  Procedures Procedures (including critical care time)  Medications Ordered in UC Medications - No data to display  Initial Impression / Assessment and Plan / UC Course  I have reviewed the triage vital signs and the nursing notes.  Pertinent labs & imaging results that were available during my care of the patient were reviewed by me and considered in my medical decision making (see chart for details).    Viral illness, asthma exacerbation.  Per patient request, rapid strep done and is negative; COVID and flu pending.  If COVID or flu positive, patient is outside the window for treatment.  Treating asthma symptoms with prednisone and continued use of albuterol inhaler.  Patient has refills of her albuterol inhaler available at her pharmacy.  Discussed symptomatic treatment including Tylenol or ibuprofen, rest, hydration.  Instructed patient to follow up with PCP if symptoms are not improving.  She agrees to plan of care.   Final Clinical Impressions(s) / UC Diagnoses   Final diagnoses:  Acute cough  Viral illness  Mild intermittent  asthma with acute exacerbation     Discharge Instructions      Take the prednisone as directed.  Use your albuterol inhaler as directed.    Your strep test is negative.  Your COVID and Flu tests are pending.    Take Tylenol or ibuprofen as needed for fever or discomfort.  Rest and keep yourself hydrated.    Follow-up with your primary care provider if your symptoms are not improving.         ED Prescriptions     Medication Sig Dispense Auth. Provider   predniSONE (DELTASONE) 10 MG tablet Take 4 tablets (40 mg total) by mouth daily for 5 days. 20 tablet Sharion Balloon, NP      PDMP not reviewed this encounter.   Sharion Balloon, NP 05/12/22 1001

## 2022-06-16 ENCOUNTER — Other Ambulatory Visit: Payer: Self-pay | Admitting: Family Medicine

## 2022-06-21 DIAGNOSIS — M79671 Pain in right foot: Secondary | ICD-10-CM | POA: Diagnosis not present

## 2022-06-21 DIAGNOSIS — M79672 Pain in left foot: Secondary | ICD-10-CM | POA: Diagnosis not present

## 2022-06-21 DIAGNOSIS — M205X1 Other deformities of toe(s) (acquired), right foot: Secondary | ICD-10-CM | POA: Diagnosis not present

## 2022-06-21 DIAGNOSIS — M722 Plantar fascial fibromatosis: Secondary | ICD-10-CM | POA: Diagnosis not present

## 2022-07-11 DIAGNOSIS — Z1231 Encounter for screening mammogram for malignant neoplasm of breast: Secondary | ICD-10-CM | POA: Diagnosis not present

## 2022-07-18 ENCOUNTER — Other Ambulatory Visit: Payer: Self-pay | Admitting: Family Medicine

## 2022-07-18 NOTE — Telephone Encounter (Signed)
Pt is overdue for her CPE (labs prior) and also AWV call, please schedule appts and then route back to me to refill, thanks

## 2022-07-19 NOTE — Telephone Encounter (Signed)
Lvmtcb, sent mychart message

## 2022-07-23 ENCOUNTER — Other Ambulatory Visit: Payer: Self-pay

## 2022-07-23 ENCOUNTER — Encounter (HOSPITAL_COMMUNITY): Payer: Self-pay | Admitting: Emergency Medicine

## 2022-07-23 ENCOUNTER — Ambulatory Visit (HOSPITAL_COMMUNITY)
Admission: EM | Admit: 2022-07-23 | Discharge: 2022-07-23 | Disposition: A | Discharge status: Home / Self Care | Payer: Medicare HMO | Attending: Family Medicine | Admitting: Family Medicine

## 2022-07-23 DIAGNOSIS — R21 Rash and other nonspecific skin eruption: Secondary | ICD-10-CM

## 2022-07-23 MED ORDER — DOXYCYCLINE HYCLATE 100 MG PO CAPS: 100.0000 mg | ORAL_CAPSULE | Freq: Two times a day (BID) | ORAL | 0 refills | Status: AC

## 2022-07-23 MED ORDER — PREDNISONE 20 MG PO TABS: 40.0000 mg | ORAL_TABLET | Freq: Every day | ORAL | 0 refills | Status: AC

## 2022-07-23 NOTE — ED Provider Notes (Signed)
[facility]    CSN: [id number] Arrival date & time: 07/23/22  1625      History   Chief Complaint Chief Complaint  Patient presents with   Insect Bite    HPI Joann Drake is a 72 y.o. female.   HPI Here for itchy red spots on her arms and back.  She first noticed them about a week ago.  She has now had some show up on her arms, as they were first noted on her back.  No fever or chills or upper respiratory symptoms or cough.  No shortness of breath. After she first noticed these then she slept with a grandchild who then also noticed these red bumps on her person.  No history of diabetes.   Past Medical History:  Diagnosis Date   Allergy    seasonal allergies   Arthritis    Asthma    childhood/hx of/ no issues recently   GERD (gastroesophageal reflux disease)    PRN OTC   Hypertension    on meds   Migraine    Seasonal allergies     Patient Active Problem List   Diagnosis Date Noted   Right low back pain 07/13/2021   Left knee pain 07/13/2021   Encounter for screening mammogram for breast cancer 05/18/2021   Cough 05/18/2021   Routine general medical examination at a health care facility 05/18/2021   Medicare annual wellness visit, subsequent 03/11/2021   Hyperlipidemia 08/09/2020   Paresthesia of right leg 08/09/2020   BMI 35.0-35.9,adult 03/11/2019   Conductive hearing loss of left ear 02/12/2018   Vitamin D deficiency 02/07/2018   Fatigue 02/06/2018   Dizziness 02/06/2018   Migraine 02/06/2018   Essential hypertension 09/03/2017   Prediabetes 09/03/2017    Past Surgical History:  Procedure Laterality Date   TOOTH EXTRACTION     wisdom teeth and additional teeth removed   TUBAL LIGATION      OB History   No obstetric history on file.      Home Medications    Prior to Admission medications   Medication Sig Start Date End Date Taking? Authorizing Provider  doxycycline (VIBRAMYCIN) 100 MG capsule Take 1 capsule (100 mg  total) by mouth 2 (two) times daily for 7 days. 07/23/22 07/30/22 Yes Kaleena Corrow, Gwenlyn Perking, MD  predniSONE (DELTASONE) 20 MG tablet Take 2 tablets (40 mg total) by mouth daily with breakfast for 5 days. 07/23/22 07/28/22 Yes Barrett Henle, MD  albuterol (VENTOLIN HFA) 108 (90 Base) MCG/ACT inhaler TAKE 2 PUFFS BY MOUTH EVERY 6 HOURS AS NEEDED FOR WHEEZE OR SHORTNESS OF BREATH 07/18/22   Tower, Roque Lias A, MD  amLODipine (NORVASC) 5 MG tablet TAKE 1 TABLET (5 MG TOTAL) BY MOUTH DAILY. 11/07/21   Tower, Wynelle Fanny, MD  aspirin EC 81 MG tablet Take 81 mg by mouth daily as needed (pain).    [provider]  aspirin-acetaminophen-caffeine (EXCEDRIN MIGRAINE) [phone] MG tablet Take 2 tablets by mouth every 8 (eight) hours as needed for headache or migraine.    [provider]  Aspirin-Salicylamide-Caffeine (BC HEADACHE POWDER PO) Take 1-2 packets by mouth 2 (two) times daily as needed (migraine).    [provider]  Cholecalciferol (VITAMIN D3) 1.25 MG (50000 UT) CAPS TAKE 1 CAPSULE BY MOUTH EVERY 7 (SEVEN) DAYS. 08/09/20   Tower, Wynelle Fanny, MD  hydrochlorothiazide (HYDRODIURIL) 25 MG tablet TAKE 1 TABLET (25 MG TOTAL) BY MOUTH DAILY. 05/12/22   Tower, Wynelle Fanny, MD  mineral oil  liquid See admin instructions. Instill 4-5 drops in to left ear as needed for wax buildup    [provider]  Multiple Vitamin (MULTIVITAMIN WITH MINERALS) TABS tablet Take 1 tablet by mouth daily.    [provider]  peppermint oil liquid Apply topically as needed (headache/migraine).    [provider]  SUMAtriptan (IMITREX) 50 MG tablet Take 1 tablet (50 mg total) by mouth every 2 (two) hours as needed for migraine. May repeat in 2 hours x 1 if needed. 02/25/18   Elby Beck, FNP  topiramate (TOPAMAX) 50 MG tablet Take 1 tablet (50 mg total) by mouth 2 (two) times daily. 02/25/18   Elby Beck, FNP    Family History Family History  Problem Relation Age of Onset   Asthma  Mother    Hypertension Mother    Miscarriages / [place] Mother    Stroke Mother    Pneumonia Father    Heart disease Sister    Hypertension Sister    Stroke Sister    Other Brother        drowned   Kidney disease Daughter    Brain cancer Daughter    Early death Son    Kidney disease Son    Colon cancer Neg Hx    Colon polyps Neg Hx    Esophageal cancer Neg Hx    Stomach cancer Neg Hx    Rectal cancer Neg Hx     Social History Social History   Tobacco Use   Smoking status: Former   Smokeless tobacco: Never  Scientific laboratory technician Use: Never used  Substance Use Topics   Alcohol use: No   Drug use: No     Allergies   Penicillins   Review of Systems Review of Systems   Physical Exam Triage Vital Signs ED Triage Vitals  Enc Vitals Group     BP 07/23/22 1652 126/83     Pulse Rate 07/23/22 1652 90     Resp 07/23/22 1652 20     Temp 07/23/22 1652 98.6 F (37 C)     Temp Source 07/23/22 1652 Oral     SpO2 07/23/22 1652 95 %     Weight --      Height --      Head Circumference --      Peak Flow --      Pain Score 07/23/22 1650 0     Pain Loc --      Pain Edu? --      Excl. in [place]? --    No data found.  Updated Vital Signs BP 126/83 (BP Location: Left Arm) Comment (BP Location): large cuff  Pulse 90   Temp 98.6 F (37 C) (Oral)   Resp 20   SpO2 95%   Visual Acuity Right Eye Distance:   Left Eye Distance:   Bilateral Distance:    Right Eye Near:   Left Eye Near:    Bilateral Near:     Physical Exam Vitals reviewed.  Constitutional:      General: She is not in acute distress.    Appearance: She is not ill-appearing, toxic-appearing or diaphoretic.  Skin:    Coloration: Skin is not jaundiced or pale.     Comments: There are scattered erythematous raised indurated areas that are circular.  They are on her upper back and on her upper and lower arms.  The largest are 2 cm in diameter.  There is no central eschar and  there is no fluctuance.   Neurological:     General: No focal deficit present.     Mental Status: She is alert and oriented to person, place, and time.  Psychiatric:        Behavior: Behavior normal.      UC Treatments / Results  Labs (all labs ordered are listed, but only abnormal results are displayed) Labs Reviewed - No data to display  EKG   Radiology No results found.  Procedures Procedures (including critical care time)  Medications Ordered in UC Medications - No data to display  Initial Impression / Assessment and Plan / UC Course  I have reviewed the triage vital signs and the nursing notes.  Pertinent labs & imaging results that were available during my care of the patient were reviewed by me and considered in my medical decision making (see chart for details).        I tried to discuss with the patient potential reasons she has these lesions.  I mention that they could be insect bites which I think is most likely, but they could be infection or cellulitis in the skin.  I am sending in prednisone and antibiotics to treat.  She was incredulous that it might be an insect bite.  I have asked her to follow-up with her primary care Final Clinical Impressions(s) / UC Diagnoses   Final diagnoses:  Rash     Discharge Instructions      Take prednisone 20 mg--2 daily for 5 days, this is for potential reaction to insect bite  Take doxycycline 100 mg --1 capsule 2 times daily for 7 days; this is an antibiotic that would treat infection in your skin; it is not a relative of penicillin  Please follow-up with your primary care; if the treatments I provide are not helping or they are not completely resolving, you may need further testing that we would not do here in the urgent care setting       ED Prescriptions     Medication Sig Dispense Auth. Provider   doxycycline (VIBRAMYCIN) 100 MG capsule Take 1 capsule (100 mg total) by mouth 2 (two) times daily for 7 days. 14 capsule Barrett Henle, MD   predniSONE (DELTASONE) 20 MG tablet Take 2 tablets (40 mg total) by mouth daily with breakfast for 5 days. 10 tablet Windy Carina Gwenlyn Perking, MD      PDMP not reviewed this encounter.   Barrett Henle, MD 07/23/22 [phone]

## 2022-07-23 NOTE — Discharge Instructions (Addendum)
Take prednisone 20 mg--2 daily for 5 days, this is for potential reaction to insect bite  Take doxycycline 100 mg --1 capsule 2 times daily for 7 days; this is an antibiotic that would treat infection in your skin; it is not a relative of penicillin  Please follow-up with your primary care; if the treatments I provide are not helping or they are not completely resolving, you may need further testing that we would not do here in the urgent care setting

## 2022-07-23 NOTE — ED Triage Notes (Signed)
Patient has insect bites to back and arms that were noticed one week ago, red, itchy bumps, sporadically positioned.  Recently spent time with a grandchild, slept with her and she has these itchy bumps too.    Has used benadryl and Cortizone cream and neither has helped.  Cold compress has been comforting

## 2022-08-03 ENCOUNTER — Ambulatory Visit: Payer: Medicare HMO | Admitting: Family Medicine

## 2022-09-06 DIAGNOSIS — R928 Other abnormal and inconclusive findings on diagnostic imaging of breast: Secondary | ICD-10-CM | POA: Diagnosis not present

## 2022-09-27 DIAGNOSIS — G43001 Migraine without aura, not intractable, with status migrainosus: Secondary | ICD-10-CM | POA: Diagnosis not present

## 2022-10-27 DIAGNOSIS — E785 Hyperlipidemia, unspecified: Secondary | ICD-10-CM | POA: Diagnosis not present

## 2022-10-27 DIAGNOSIS — R7303 Prediabetes: Secondary | ICD-10-CM | POA: Diagnosis not present

## 2022-10-31 DIAGNOSIS — Z0000 Encounter for general adult medical examination without abnormal findings: Secondary | ICD-10-CM | POA: Diagnosis not present

## 2022-10-31 DIAGNOSIS — G43809 Other migraine, not intractable, without status migrainosus: Secondary | ICD-10-CM | POA: Diagnosis not present

## 2022-10-31 DIAGNOSIS — I10 Essential (primary) hypertension: Secondary | ICD-10-CM | POA: Diagnosis not present

## 2022-10-31 DIAGNOSIS — E785 Hyperlipidemia, unspecified: Secondary | ICD-10-CM | POA: Diagnosis not present

## 2022-10-31 DIAGNOSIS — R7301 Impaired fasting glucose: Secondary | ICD-10-CM | POA: Diagnosis not present

## 2022-10-31 DIAGNOSIS — M5136 Other intervertebral disc degeneration, lumbar region: Secondary | ICD-10-CM | POA: Diagnosis not present

## 2022-10-31 DIAGNOSIS — R7989 Other specified abnormal findings of blood chemistry: Secondary | ICD-10-CM | POA: Diagnosis not present

## 2022-11-16 DIAGNOSIS — M5451 Vertebrogenic low back pain: Secondary | ICD-10-CM | POA: Diagnosis not present

## 2022-11-16 DIAGNOSIS — M4316 Spondylolisthesis, lumbar region: Secondary | ICD-10-CM | POA: Diagnosis not present

## 2022-11-16 DIAGNOSIS — M47896 Other spondylosis, lumbar region: Secondary | ICD-10-CM | POA: Diagnosis not present

## 2022-12-15 ENCOUNTER — Other Ambulatory Visit: Payer: Self-pay | Admitting: Family Medicine

## 2022-12-18 NOTE — Telephone Encounter (Signed)
Pt is over due to an appointment.  Called and left a message to call and make an appointment.

## 2022-12-19 NOTE — Telephone Encounter (Signed)
Please try to get his CPE or at least a med refill appt scheduled, thanks

## 2022-12-19 NOTE — Telephone Encounter (Signed)
LVM for patient c/b and schedule.

## 2022-12-20 NOTE — Telephone Encounter (Signed)
LVMTCB and schedule, also sent mychart message

## 2022-12-22 NOTE — Telephone Encounter (Signed)
3rd attempt to call patient, no answer but lvm for patient to callback and schedule.
